# Patient Record
Sex: Male | Born: 1970
Health system: Southern US, Community
[De-identification: ages and names within clinical notes are randomized; demographics above are authoritative.]

## PROBLEM LIST (undated history)

## (undated) DIAGNOSIS — T7840XA Allergy, unspecified, initial encounter: Secondary | ICD-10-CM

## (undated) DIAGNOSIS — H35712 Central serous chorioretinopathy, left eye: Secondary | ICD-10-CM

## (undated) DIAGNOSIS — G4733 Obstructive sleep apnea (adult) (pediatric): Principal | ICD-10-CM

## (undated) HISTORY — PX: VASECTOMY: SHX75

## (undated) HISTORY — DX: Obstructive sleep apnea (adult) (pediatric): G47.33

## (undated) HISTORY — PX: CHOLECYSTECTOMY: SHX55

## (undated) HISTORY — DX: Central serous chorioretinopathy, left eye: H35.712

## (undated) HISTORY — DX: Allergy, unspecified, initial encounter: T78.40XA

---

## 1998-12-03 ENCOUNTER — Encounter: Payer: Self-pay | Admitting: Emergency Medicine

## 1998-12-03 ENCOUNTER — Emergency Department (HOSPITAL_COMMUNITY): Admission: EM | Admit: 1998-12-03 | Discharge: 1998-12-03 | Payer: Self-pay | Admitting: Emergency Medicine

## 1998-12-25 ENCOUNTER — Encounter: Payer: Self-pay | Admitting: General Surgery

## 1998-12-25 ENCOUNTER — Ambulatory Visit (HOSPITAL_COMMUNITY): Admission: RE | Admit: 1998-12-25 | Discharge: 1998-12-26 | Payer: Self-pay | Admitting: General Surgery

## 2001-11-17 ENCOUNTER — Emergency Department (HOSPITAL_COMMUNITY): Admission: EM | Admit: 2001-11-17 | Discharge: 2001-11-18 | Payer: Self-pay | Admitting: Emergency Medicine

## 2002-01-13 ENCOUNTER — Encounter: Admission: RE | Admit: 2002-01-13 | Discharge: 2002-02-21 | Payer: Self-pay | Admitting: Family Medicine

## 2002-02-11 ENCOUNTER — Encounter: Payer: Self-pay | Admitting: Family Medicine

## 2002-02-11 ENCOUNTER — Encounter: Admission: RE | Admit: 2002-02-11 | Discharge: 2002-02-11 | Payer: Self-pay | Admitting: Family Medicine

## 2003-10-25 ENCOUNTER — Emergency Department (HOSPITAL_COMMUNITY): Admission: EM | Admit: 2003-10-25 | Discharge: 2003-10-25 | Payer: Self-pay | Admitting: Family Medicine

## 2008-12-18 ENCOUNTER — Ambulatory Visit: Payer: Self-pay | Admitting: Family Medicine

## 2008-12-18 DIAGNOSIS — N410 Acute prostatitis: Secondary | ICD-10-CM | POA: Insufficient documentation

## 2008-12-18 DIAGNOSIS — M79609 Pain in unspecified limb: Secondary | ICD-10-CM | POA: Insufficient documentation

## 2008-12-20 ENCOUNTER — Ambulatory Visit: Payer: Self-pay | Admitting: Family Medicine

## 2008-12-20 LAB — CONVERTED CEMR LAB
Bilirubin Urine: NEGATIVE
Blood in Urine, dipstick: NEGATIVE
Glucose, Urine, Semiquant: NEGATIVE
Ketones, urine, test strip: NEGATIVE
Nitrite: NEGATIVE
Protein, U semiquant: NEGATIVE
Specific Gravity, Urine: 1.02
Urobilinogen, UA: 0.2
WBC Urine, dipstick: NEGATIVE
pH: 5.5

## 2008-12-21 LAB — CONVERTED CEMR LAB
ALT: 43 units/L (ref 0–53)
AST: 30 units/L (ref 0–37)
Albumin: 4.1 g/dL (ref 3.5–5.2)
Alkaline Phosphatase: 54 units/L (ref 39–117)
BUN: 15 mg/dL (ref 6–23)
Basophils Absolute: 0.1 10*3/uL (ref 0.0–0.1)
Basophils Relative: 0.9 % (ref 0.0–3.0)
Bilirubin, Direct: 0 mg/dL (ref 0.0–0.3)
CO2: 28 meq/L (ref 19–32)
Calcium: 9.1 mg/dL (ref 8.4–10.5)
Chloride: 106 meq/L (ref 96–112)
Cholesterol: 174 mg/dL (ref 0–200)
Creatinine, Ser: 1 mg/dL (ref 0.4–1.5)
Eosinophils Absolute: 0.2 10*3/uL (ref 0.0–0.7)
Eosinophils Relative: 2 % (ref 0.0–5.0)
GFR calc non Af Amer: 88.23 mL/min (ref >60)
Glucose, Bld: 82 mg/dL (ref 70–99)
HCT: 47.9 % (ref 39.0–52.0)
HDL: 35.4 mg/dL — ABNORMAL LOW (ref >39.00)
Hemoglobin: 16.5 g/dL (ref 13.0–17.0)
LDL Cholesterol: 104 mg/dL — ABNORMAL HIGH (ref 0–99)
Lymphocytes Relative: 22 % (ref 12.0–46.0)
Lymphs Abs: 2.1 10*3/uL (ref 0.7–4.0)
MCHC: 34.3 g/dL (ref 30.0–36.0)
MCV: 90.9 fL (ref 78.0–100.0)
Monocytes Absolute: 1.2 10*3/uL — ABNORMAL HIGH (ref 0.1–1.0)
Monocytes Relative: 12.3 % — ABNORMAL HIGH (ref 3.0–12.0)
Neutro Abs: 5.8 10*3/uL (ref 1.4–7.7)
Neutrophils Relative %: 62.8 % (ref 43.0–77.0)
Platelets: 234 10*3/uL (ref 150.0–400.0)
Potassium: 3.9 meq/L (ref 3.5–5.1)
RBC: 5.28 M/uL (ref 4.22–5.81)
RDW: 12.8 % (ref 11.5–14.6)
Sodium: 140 meq/L (ref 135–145)
TSH: 1.81 microintl units/mL (ref 0.35–5.50)
Total Bilirubin: 1.2 mg/dL (ref 0.3–1.2)
Total CHOL/HDL Ratio: 5
Total Protein: 7.5 g/dL (ref 6.0–8.3)
Triglycerides: 173 mg/dL — ABNORMAL HIGH (ref 0.0–149.0)
VLDL: 34.6 mg/dL (ref 0.0–40.0)
WBC: 9.4 10*3/uL (ref 4.5–10.5)

## 2009-01-10 ENCOUNTER — Encounter: Payer: Self-pay | Admitting: Family Medicine

## 2010-02-06 ENCOUNTER — Ambulatory Visit: Payer: Self-pay | Admitting: Family Medicine

## 2010-02-06 LAB — CONVERTED CEMR LAB
Bilirubin Urine: NEGATIVE
Blood in Urine, dipstick: NEGATIVE
Glucose, Urine, Semiquant: NEGATIVE
Ketones, urine, test strip: NEGATIVE
Nitrite: NEGATIVE
Protein, U semiquant: NEGATIVE
Specific Gravity, Urine: >=1.03
Urobilinogen, UA: 0.2
WBC Urine, dipstick: NEGATIVE
pH: 6

## 2010-02-08 LAB — CONVERTED CEMR LAB
BUN: 16 mg/dL (ref 6–23)
Basophils Absolute: 0.1 10*3/uL (ref 0.0–0.1)
Basophils Relative: 1.3 % (ref 0.0–3.0)
CO2: 30 meq/L (ref 19–32)
Calcium: 9.4 mg/dL (ref 8.4–10.5)
Chloride: 105 meq/L (ref 96–112)
Cholesterol: 148 mg/dL (ref 0–200)
Creatinine, Ser: 1 mg/dL (ref 0.4–1.5)
Eosinophils Absolute: 0.2 10*3/uL (ref 0.0–0.7)
Eosinophils Relative: 1.9 % (ref 0.0–5.0)
GFR calc non Af Amer: 91.96 mL/min (ref >60)
Glucose, Bld: 88 mg/dL (ref 70–99)
HCT: 48.2 % (ref 39.0–52.0)
HDL: 41.8 mg/dL (ref >39.00)
Hemoglobin: 16.2 g/dL (ref 13.0–17.0)
LDL Cholesterol: 89 mg/dL (ref 0–99)
Lymphocytes Relative: 23 % (ref 12.0–46.0)
Lymphs Abs: 2.4 10*3/uL (ref 0.7–4.0)
MCHC: 33.7 g/dL (ref 30.0–36.0)
MCV: 92.4 fL (ref 78.0–100.0)
Monocytes Absolute: 1.1 10*3/uL — ABNORMAL HIGH (ref 0.1–1.0)
Monocytes Relative: 10.8 % (ref 3.0–12.0)
Neutro Abs: 6.5 10*3/uL (ref 1.4–7.7)
Neutrophils Relative %: 63 % (ref 43.0–77.0)
Platelets: 259 10*3/uL (ref 150.0–400.0)
Potassium: 4.8 meq/L (ref 3.5–5.1)
RBC: 5.21 M/uL (ref 4.22–5.81)
RDW: 13.8 % (ref 11.5–14.6)
Sodium: 140 meq/L (ref 135–145)
Total CHOL/HDL Ratio: 4
Triglycerides: 86 mg/dL (ref 0.0–149.0)
VLDL: 17.2 mg/dL (ref 0.0–40.0)
WBC: 10.3 10*3/uL (ref 4.5–10.5)

## 2010-02-12 ENCOUNTER — Ambulatory Visit: Payer: Self-pay | Admitting: Family Medicine

## 2010-02-12 DIAGNOSIS — R55 Syncope and collapse: Secondary | ICD-10-CM | POA: Insufficient documentation

## 2010-02-12 LAB — CONVERTED CEMR LAB
ALT: 34 units/L (ref 0–53)
AST: 30 units/L (ref 0–37)
Albumin: 4.1 g/dL (ref 3.5–5.2)
Alkaline Phosphatase: 56 units/L (ref 39–117)
Bilirubin, Direct: 0.3 mg/dL (ref 0.0–0.3)
TSH: 1.33 microintl units/mL (ref 0.35–5.50)
Total Bilirubin: 0.8 mg/dL (ref 0.3–1.2)
Total Protein: 7.1 g/dL (ref 6.0–8.3)

## 2010-07-09 NOTE — Letter (Signed)
Summary: [facility]   Imported By: Maryln Gottron 01/25/2009 10:05:49  _____________________________________________________________________  External Attachment:    Type:   Image     Comment:   External Document

## 2010-07-09 NOTE — Assessment & Plan Note (Signed)
Summary: cpx/njr   Vital Signs:  Patient profile:   40 year old male Height:      67.25 inches Weight:      245 pounds BMI:     38.23 O2 Sat:      98 % Temp:     99 degrees F Pulse rate:   84 / minute BP sitting:   122 / 84  (left arm) Cuff size:   large  Vitals Entered By: Pura Spice, RN (February 12, 2010 10:06 AM) CC: cpx no complaints Is Patient Diabetic? No   History of Present Illness: 40 yr old male for a cpx. He feels fine but he does describe a syncopal episode he experienced several months ago. he thinks he was simply dehydrated. He had been out in the hot sun and drinking beer all day, and he had had no water to drink and very little food. That evening he felt weak and slumped to the floor. His wife called EMS, and their exam was normal. he regained consciousness after just a minute or so. he had no residual symptoms, and he has felt fine ever since. Also he describes constant trouble breathing through his nose, especially at night. No other allergy symptoms.   Allergies (verified): No Known Drug Allergies  Past History:  Past Medical History: Reviewed history from 12/18/2008 and no changes required. torn left ACL, never had surgery  Past Surgical History: Reviewed history from 12/18/2008 and no changes required. Cholecystectomy Vasectomy  Family History: Reviewed history from 12/18/2008 and no changes required. Family History of Alcoholism/Addiction Family History of Arthritis Family History of CAD Male 1st degree relative <50 Family History of Colon CA 1st degree relative <60 Family History Depression Family History High cholesterol Family History Hypertension Family History Ovarian cancer Family History of Stroke M 1st degree relative <50 Family History Diabetes 1st degree relative  Social History: Reviewed history from 12/18/2008 and no changes required. Married Never Smoked Alcohol use-yes Drug use-no Regular exercise-no Occupation:  Charity fundraiser  Review of Systems  The patient denies anorexia, fever, weight loss, weight gain, vision loss, decreased hearing, hoarseness, chest pain, syncope, dyspnea on exertion, peripheral edema, prolonged cough, headaches, hemoptysis, abdominal pain, melena, hematochezia, severe indigestion/heartburn, hematuria, incontinence, genital sores, muscle weakness, suspicious skin lesions, transient blindness, difficulty walking, depression, unusual weight change, abnormal bleeding, enlarged lymph nodes, angioedema, breast masses, and testicular masses.    Physical Exam  General:  overweight-appearing.   Head:  Normocephalic and atraumatic without obvious abnormalities. No apparent alopecia or balding. Eyes:  No corneal or conjunctival inflammation noted. EOMI. Perrla. Funduscopic exam benign, without hemorrhages, exudates or papilledema. Vision grossly normal. Ears:  External ear exam shows no significant lesions or deformities.  Otoscopic examination reveals clear canals, tympanic membranes are intact bilaterally without bulging, retraction, inflammation or discharge. Hearing is grossly normal bilaterally. Nose:  his septum is deviated to the right, and the right nostril is extremely narrow Mouth:  Oral mucosa and oropharynx without lesions or exudates.  Teeth in good repair. Neck:  No deformities, masses, or tenderness noted. No carotid bruits Chest Wall:  No deformities, masses, tenderness or gynecomastia noted. Lungs:  Normal respiratory effort, chest expands symmetrically. Lungs are clear to auscultation, no crackles or wheezes. Heart:  Normal rate and regular rhythm. S1 and S2 normal without gallop, murmur, click, rub or other extra sounds. EKG normal Abdomen:  Bowel sounds positive,abdomen soft and non-tender without masses, organomegaly or hernias noted. Genitalia:  Testes bilaterally descended without nodularity,  tenderness or masses. No scrotal masses or lesions. No penis lesions or urethral  discharge. Msk:  No deformity or scoliosis noted of thoracic or lumbar spine.   Pulses:  R and L carotid,radial,femoral,dorsalis pedis and posterior tibial pulses are full and equal bilaterally Extremities:  No clubbing, cyanosis, edema, or deformity noted with normal full range of motion of all joints.   Neurologic:  No cranial nerve deficits noted. Station and gait are normal. Plantar reflexes are down-going bilaterally. DTRs are symmetrical throughout. Sensory, motor and coordinative functions appear intact. Skin:  Intact without suspicious lesions or rashes Cervical Nodes:  No lymphadenopathy noted Axillary Nodes:  No palpable lymphadenopathy Inguinal Nodes:  No significant adenopathy Psych:  Cognition and judgment appear intact. Alert and cooperative with normal attention span and concentration. No apparent delusions, illusions, hallucinations   Impression & Recommendations:  Problem # 1:  HEALTH MAINTENANCE EXAM (ICD-V70.0)  Orders: TLB-Hepatic/Liver Function Pnl (80076-HEPATIC) TLB-TSH (Thyroid Stimulating Hormone) (84443-TSH) Venipuncture (16073)  Complete Medication List: 1)  Nsaids  .... As needed 2)  Flonase 50 Mcg/act Susp (Fluticasone propionate) .... 2 sprays each nostril once daily  Other Orders: EKG w/ Interpretation (93000)  Patient Instructions: 1)  It is important that you exercise reguarly at least 20 minutes 5 times a week. If you develop chest pain, have severe difficulty breathing, or feel very tired, stop exercising immediately and seek medical attention.  2)  You need to lose weight. Consider a lower calorie diet and regular exercise.  3)  Try Flonase for one month Prescriptions: FLONASE 50 MCG/ACT SUSP (FLUTICASONE PROPIONATE) 2 sprays each nostril once daily  #30 x 11   Entered and Authorized by:   Nelwyn Salisbury MD   Signed by:   Nelwyn Salisbury MD on 02/12/2010   Method used:   [facility]        [facility] [address]       [address]       [place], [place]  [address]       Ph: [phone] or [phone]       Fax: [phone]   RxID:   [id number]

## 2010-07-09 NOTE — Assessment & Plan Note (Signed)
Summary: TO BE EST/NJR   Vital Signs:  Patient profile:   40 year old male Height:      67 inches Weight:      269 pounds BMI:     42.28 Temp:     98.9 degrees F oral Pulse rate:   96 / minute Pulse rhythm:   regular BP sitting:   134 / 80  (left arm) Cuff size:   large  Vitals Entered By: Alfred Levins, CMA (December 18, 2008 1:34 PM) CC: npx, not fasting   History of Present Illness: 40 yr old male to establish and for cpx. he has not seen a doctor for years. He knows he is overweight. For about 3 months has had sharp pains at the right 2nd MTP joint which started after a hunting trip. No swelling or redness or warmth noted. Motrin helps. Also for the  past 2 months has occasional urgency to urinate and some rectal discomfort. No burning or DC.   Preventive Screening-Counseling & Management  Alcohol-Tobacco     Smoking Status: never  Caffeine-Diet-Exercise     Does Patient Exercise: no      Drug Use:  no.    Allergies (verified): No Known Drug Allergies  Past History:  Past Medical History: torn left ACL, never had surgery  Past Surgical History: Cholecystectomy Vasectomy  Family History: Reviewed history and no changes required. Family History of Alcoholism/Addiction Family History of Arthritis Family History of CAD Male 1st degree relative <50 Family History of Colon CA 1st degree relative <60 Family History Depression Family History High cholesterol Family History Hypertension Family History Ovarian cancer Family History of Stroke M 1st degree relative <50 Family History Diabetes 1st degree relative  Social History: Reviewed history and no changes required. Married Never Smoked Alcohol use-yes Drug use-no Regular exercise-no Occupation: Charity fundraiser  Smoking Status:  never Drug Use:  no Does Patient Exercise:  no Occupation:  employed  Review of Systems  The patient denies anorexia, fever, weight loss, weight gain, vision loss, decreased hearing,  hoarseness, chest pain, syncope, dyspnea on exertion, peripheral edema, prolonged cough, headaches, hemoptysis, abdominal pain, melena, hematochezia, severe indigestion/heartburn, hematuria, incontinence, genital sores, muscle weakness, suspicious skin lesions, transient blindness, difficulty walking, depression, unusual weight change, abnormal bleeding, enlarged lymph nodes, angioedema, breast masses, and testicular masses.    Physical Exam  General:  overweight-appearing.   Head:  Normocephalic and atraumatic without obvious abnormalities. No apparent alopecia or balding. Eyes:  No corneal or conjunctival inflammation noted. EOMI. Perrla. Funduscopic exam benign, without hemorrhages, exudates or papilledema. Vision grossly normal. Ears:  External ear exam shows no significant lesions or deformities.  Otoscopic examination reveals clear canals, tympanic membranes are intact bilaterally without bulging, retraction, inflammation or discharge. Hearing is grossly normal bilaterally. Nose:  External nasal examination shows no deformity or inflammation. Nasal mucosa are pink and moist without lesions or exudates. Mouth:  Oral mucosa and oropharynx without lesions or exudates.  Teeth in good repair. Neck:  No deformities, masses, or tenderness noted. Chest Wall:  No deformities, masses, tenderness or gynecomastia noted. Lungs:  Normal respiratory effort, chest expands symmetrically. Lungs are clear to auscultation, no crackles or wheezes. Heart:  Normal rate and regular rhythm. S1 and S2 normal without gallop, murmur, click, rub or other extra sounds. Abdomen:  Bowel sounds positive,abdomen soft and non-tender without masses, organomegaly or hernias noted. Rectal:  No external abnormalities noted. Normal sphincter tone. No rectal masses. Genitalia:  Testes bilaterally descended without nodularity, tenderness or  masses. No scrotal masses or lesions. No penis lesions or urethral discharge. Prostate:  no  nodules, no asymmetry, tender, and 1+ enlarged.   Msk:  No deformity or scoliosis noted of thoracic or lumbar spine.   Pulses:  R and L carotid,radial,femoral,dorsalis pedis and posterior tibial pulses are full and equal bilaterally Extremities:  normal except tender at the right 2nd MTP Neurologic:  No cranial nerve deficits noted. Station and gait are normal. Plantar reflexes are down-going bilaterally. DTRs are symmetrical throughout. Sensory, motor and coordinative functions appear intact. Skin:  Intact without suspicious lesions or rashes Cervical Nodes:  No lymphadenopathy noted Axillary Nodes:  No palpable lymphadenopathy Inguinal Nodes:  No significant adenopathy Psych:  Cognition and judgment appear intact. Alert and cooperative with normal attention span and concentration. No apparent delusions, illusions, hallucinations   Impression & Recommendations:  Problem # 1:  HEALTH MAINTENANCE EXAM (ICD-V70.0)  Problem # 2:  PROSTATITIS, ACUTE (ICD-601.0)  Problem # 3:  FOOT PAIN (ICD-729.5)  Orders: Podiatry Referral (Podiatry)  Complete Medication List: 1)  Nsaids  .... As needed 2)  Doxycycline Hyclate 100 Mg Caps (Doxycycline hyclate) .... Two times a day  Patient Instructions: 1)  It is important that you exercise reguarly at least 20 minutes 5 times a week. If you develop chest pain, have severe difficulty breathing, or feel very tired, stop exercising immediately and seek medical attention.  2)  You need to lose weight. Consider a lower calorie diet and regular exercise.  3)  He has a low level prostatitis, so treat with Doxycycline.  4)  Refer to Podiatry Prescriptions: DOXYCYCLINE HYCLATE 100 MG CAPS (DOXYCYCLINE HYCLATE) two times a day  #28 x 0   Entered and Authorized by:   Nelwyn Salisbury MD   Signed by:   Nelwyn Salisbury MD on 12/18/2008   Method used:   [facility]        [facility] [address] [facility]       [address]       [place], [place]  [address]        Ph: [phone] or [phone]       Fax: [phone]   RxID:   [id number]

## 2010-07-16 ENCOUNTER — Ambulatory Visit (INDEPENDENT_AMBULATORY_CARE_PROVIDER_SITE_OTHER): Payer: 59 | Admitting: Family Medicine

## 2010-07-16 ENCOUNTER — Encounter: Payer: Self-pay | Admitting: Family Medicine

## 2010-07-16 VITALS — BP 140/90 | HR 93 | Temp 98.9°F | Wt 262.0 lb

## 2010-07-16 DIAGNOSIS — H6990 Unspecified Eustachian tube disorder, unspecified ear: Secondary | ICD-10-CM

## 2010-07-16 DIAGNOSIS — H6980 Other specified disorders of Eustachian tube, unspecified ear: Secondary | ICD-10-CM

## 2010-07-16 MED ORDER — METHYLPREDNISOLONE ACETATE 80 MG/ML IJ SUSP
120.0000 mg | Freq: Once | INTRAMUSCULAR | Status: AC
  Administered 2010-07-16: 120 mg via INTRAMUSCULAR

## 2010-07-16 MED ORDER — FLUTICASONE PROPIONATE 50 MCG/ACT NA SUSP: 1.0000 | Freq: Every day | NASAL | Status: DC

## 2010-07-16 NOTE — Progress Notes (Signed)
  Subjective:    Patient ID: Mitchell Castillo, male    DOB: [birth year], 40 y.o.   MRN: [id number]  HPI Here for one week of sinus pressure, ear pressure, and decreased hearing. No fever or HA or ST or cough.    Review of Systems  Constitutional: Negative.   HENT: Positive for hearing loss, ear pain, congestion, sinus pressure and tinnitus. Negative for facial swelling, sneezing, postnasal drip and ear discharge.   Eyes: Negative.   Respiratory: Negative.        Objective:   Physical Exam  Constitutional: He appears well-developed and well-nourished. No distress.  HENT:  Head: Normocephalic and atraumatic.  Right Ear: External ear normal.  Left Ear: External ear normal.  Nose: Nose normal.  Mouth/Throat: Oropharynx is clear and moist. No oropharyngeal exudate.  Eyes: Conjunctivae and EOM are normal. Pupils are equal, round, and reactive to light. Right eye exhibits no discharge. Left eye exhibits no discharge.  Neck: Normal range of motion. Neck supple. No thyromegaly present.  Pulmonary/Chest: Effort normal and breath sounds normal. No respiratory distress. He has no wheezes. He has no rales. He exhibits no tenderness.  Lymphadenopathy:    He has no cervical adenopathy.          Assessment & Plan:  This seems to be from an allergic cause.

## 2011-04-09 ENCOUNTER — Ambulatory Visit (INDEPENDENT_AMBULATORY_CARE_PROVIDER_SITE_OTHER): Payer: 59 | Admitting: Family Medicine

## 2011-04-09 ENCOUNTER — Encounter: Payer: Self-pay | Admitting: Family Medicine

## 2011-04-09 VITALS — BP 140/86 | HR 108 | Temp 99.2°F | Wt 266.0 lb

## 2011-04-09 DIAGNOSIS — H6980 Other specified disorders of Eustachian tube, unspecified ear: Secondary | ICD-10-CM

## 2011-04-09 DIAGNOSIS — Z9109 Other allergy status, other than to drugs and biological substances: Secondary | ICD-10-CM

## 2011-04-09 DIAGNOSIS — J309 Allergic rhinitis, unspecified: Secondary | ICD-10-CM

## 2011-04-09 MED ORDER — FLUTICASONE PROPIONATE 50 MCG/ACT NA SUSP: 2.0000 | Freq: Every day | NASAL | Status: DC

## 2011-04-09 MED ORDER — METHYLPREDNISOLONE ACETATE 80 MG/ML IJ SUSP
120.0000 mg | Freq: Once | INTRAMUSCULAR | Status: AC
  Administered 2011-04-09: 120 mg via INTRAMUSCULAR

## 2011-04-09 NOTE — Progress Notes (Signed)
Addended by: Aniceto Boss A on: 04/09/2011 10:40 AM   Modules accepted: Orders

## 2011-04-09 NOTE — Progress Notes (Signed)
  Subjective:    Patient ID: Mitchell Castillo, male    DOB: Jan 30, 1971, 40 y.o.   MRN: [id number]  HPI Here for one week of pressure and pain in the left ear. No fever or ST or cough.    Review of Systems  Constitutional: Negative.   HENT: Positive for ear pain. Negative for hearing loss, congestion, rhinorrhea, postnasal drip and tinnitus.   Eyes: Negative.   Respiratory: Negative.        Objective:   Physical Exam  Constitutional: He appears well-developed and well-nourished.  HENT:  Right Ear: External ear normal.  Left Ear: External ear normal.  Nose: Nose normal.  Mouth/Throat: Oropharynx is clear and moist. No oropharyngeal exudate.  Eyes: Conjunctivae are normal. Pupils are equal, round, and reactive to light.  Neck: Neck supple. No thyromegaly present.  Pulmonary/Chest: Effort normal and breath sounds normal.  Lymphadenopathy:    He has no cervical adenopathy.          Assessment & Plan:  Refilled Flonase. Add Claritin D prn

## 2012-04-02 ENCOUNTER — Telehealth: Payer: Self-pay | Admitting: Family Medicine

## 2012-04-02 NOTE — Telephone Encounter (Signed)
Shashank wife Raynelle Fanning will be new pt . Can I sch?

## 2012-04-02 NOTE — Telephone Encounter (Signed)
Pt wife sch with Dr Clent Ridges

## 2012-04-02 NOTE — Telephone Encounter (Signed)
Per Dr. Clent Ridges - ok to schedule

## 2013-04-25 ENCOUNTER — Ambulatory Visit: Payer: 59 | Attending: Physician Assistant | Admitting: Physical Therapy

## 2013-04-25 DIAGNOSIS — M25579 Pain in unspecified ankle and joints of unspecified foot: Secondary | ICD-10-CM | POA: Insufficient documentation

## 2013-04-25 DIAGNOSIS — R5381 Other malaise: Secondary | ICD-10-CM | POA: Insufficient documentation

## 2013-04-25 DIAGNOSIS — M25673 Stiffness of unspecified ankle, not elsewhere classified: Secondary | ICD-10-CM | POA: Insufficient documentation

## 2013-04-25 DIAGNOSIS — IMO0001 Reserved for inherently not codable concepts without codable children: Secondary | ICD-10-CM | POA: Insufficient documentation

## 2013-04-25 DIAGNOSIS — M25676 Stiffness of unspecified foot, not elsewhere classified: Secondary | ICD-10-CM | POA: Insufficient documentation

## 2013-04-28 ENCOUNTER — Ambulatory Visit: Payer: 59

## 2013-04-29 ENCOUNTER — Ambulatory Visit: Payer: 59 | Admitting: Physical Therapy

## 2013-05-02 ENCOUNTER — Ambulatory Visit: Payer: 59 | Admitting: Physical Therapy

## 2013-05-09 ENCOUNTER — Ambulatory Visit: Payer: 59 | Attending: Physician Assistant | Admitting: Physical Therapy

## 2013-05-09 DIAGNOSIS — M25673 Stiffness of unspecified ankle, not elsewhere classified: Secondary | ICD-10-CM | POA: Insufficient documentation

## 2013-05-09 DIAGNOSIS — M25579 Pain in unspecified ankle and joints of unspecified foot: Secondary | ICD-10-CM | POA: Insufficient documentation

## 2013-05-09 DIAGNOSIS — IMO0001 Reserved for inherently not codable concepts without codable children: Secondary | ICD-10-CM | POA: Insufficient documentation

## 2013-05-09 DIAGNOSIS — M25676 Stiffness of unspecified foot, not elsewhere classified: Secondary | ICD-10-CM | POA: Insufficient documentation

## 2013-05-09 DIAGNOSIS — R5381 Other malaise: Secondary | ICD-10-CM | POA: Insufficient documentation

## 2013-05-11 ENCOUNTER — Ambulatory Visit: Payer: 59 | Admitting: Physical Therapy

## 2013-05-17 ENCOUNTER — Ambulatory Visit: Payer: 59 | Admitting: Physical Therapy

## 2013-05-19 ENCOUNTER — Ambulatory Visit: Payer: 59

## 2013-05-25 ENCOUNTER — Ambulatory Visit: Payer: 59

## 2013-05-27 ENCOUNTER — Ambulatory Visit: Payer: 59 | Admitting: Physical Therapy

## 2013-05-30 ENCOUNTER — Ambulatory Visit: Payer: 59 | Admitting: Physical Therapy

## 2013-06-07 ENCOUNTER — Ambulatory Visit: Payer: 59 | Admitting: Physical Therapy

## 2013-06-08 ENCOUNTER — Other Ambulatory Visit (HOSPITAL_COMMUNITY): Payer: Self-pay | Admitting: Orthopedic Surgery

## 2013-06-08 DIAGNOSIS — M25571 Pain in right ankle and joints of right foot: Secondary | ICD-10-CM

## 2013-06-21 ENCOUNTER — Ambulatory Visit (HOSPITAL_COMMUNITY)
Admission: RE | Admit: 2013-06-21 | Discharge: 2013-06-21 | Disposition: A | Discharge status: Home / Self Care | Payer: 59 | Source: Ambulatory Visit | Attending: Orthopedic Surgery | Admitting: Orthopedic Surgery

## 2013-06-21 DIAGNOSIS — S8990XA Unspecified injury of unspecified lower leg, initial encounter: Secondary | ICD-10-CM | POA: Insufficient documentation

## 2013-06-21 DIAGNOSIS — M25571 Pain in right ankle and joints of right foot: Secondary | ICD-10-CM

## 2013-06-21 DIAGNOSIS — M79609 Pain in unspecified limb: Secondary | ICD-10-CM | POA: Insufficient documentation

## 2013-06-21 DIAGNOSIS — Y33XXXA Other specified events, undetermined intent, initial encounter: Secondary | ICD-10-CM | POA: Insufficient documentation

## 2013-06-21 DIAGNOSIS — M898X9 Other specified disorders of bone, unspecified site: Secondary | ICD-10-CM | POA: Insufficient documentation

## 2013-06-21 DIAGNOSIS — S99929A Unspecified injury of unspecified foot, initial encounter: Secondary | ICD-10-CM

## 2013-06-21 DIAGNOSIS — S99919A Unspecified injury of unspecified ankle, initial encounter: Secondary | ICD-10-CM | POA: Insufficient documentation

## 2013-07-04 ENCOUNTER — Encounter: Payer: Self-pay | Admitting: Family Medicine

## 2013-07-04 ENCOUNTER — Ambulatory Visit (INDEPENDENT_AMBULATORY_CARE_PROVIDER_SITE_OTHER): Payer: 59 | Admitting: Family Medicine

## 2013-07-04 VITALS — BP 134/76 | HR 104 | Temp 98.3°F | Ht 67.25 in | Wt 273.0 lb

## 2013-07-04 DIAGNOSIS — B029 Zoster without complications: Secondary | ICD-10-CM

## 2013-07-04 DIAGNOSIS — L259 Unspecified contact dermatitis, unspecified cause: Secondary | ICD-10-CM

## 2013-07-04 DIAGNOSIS — L309 Dermatitis, unspecified: Secondary | ICD-10-CM | POA: Insufficient documentation

## 2013-07-04 MED ORDER — VALACYCLOVIR HCL 1 G PO TABS: 1000.0000 mg | ORAL_TABLET | Freq: Three times a day (TID) | ORAL | Status: DC

## 2013-07-04 MED ORDER — HALOBETASOL PROPIONATE 0.05 % EX OINT: TOPICAL_OINTMENT | Freq: Two times a day (BID) | CUTANEOUS | Status: DC

## 2013-07-04 MED ORDER — METHYLPREDNISOLONE 4 MG PO KIT: PACK | ORAL | Status: AC

## 2013-07-04 NOTE — Progress Notes (Signed)
   Subjective:    Patient ID: Mitchell Castillo, male    DOB: [birth year], 43 y.o.   MRN: [id number]  HPI Here for two things. First he has had a patch of itchy skin on the right lower leg for a year or so. Second he started having burning pains around the right upper chest and back 10 days ago, and then he noticed a red rash in this area 2 days ago.    Review of Systems  Constitutional: Negative.   Skin: Positive for rash.       Objective:   Physical Exam  Constitutional: He appears well-developed and well-nourished.  Skin:  Scattered red macules and vesicles over the right upper back and right upper chest. The right lower leg has a patch of red macular scaly skin           Assessment & Plan:  Treat the shingles with valtrex and a steroid taper. Treat the eczema with halobetasol.

## 2013-07-04 NOTE — Progress Notes (Signed)
Pre visit review using our clinic review tool, if applicable. No additional management support is needed unless otherwise documented below in the visit note. 

## 2015-03-02 ENCOUNTER — Ambulatory Visit (INDEPENDENT_AMBULATORY_CARE_PROVIDER_SITE_OTHER): Payer: 59 | Admitting: Family Medicine

## 2015-03-02 ENCOUNTER — Encounter: Payer: Self-pay | Admitting: Family Medicine

## 2015-03-02 VITALS — BP 127/88 | HR 80 | Temp 98.4°F | Ht 67.25 in | Wt 264.0 lb

## 2015-03-02 DIAGNOSIS — K529 Noninfective gastroenteritis and colitis, unspecified: Secondary | ICD-10-CM

## 2015-03-02 MED ORDER — CIPROFLOXACIN HCL 500 MG PO TABS: 500.0000 mg | ORAL_TABLET | Freq: Two times a day (BID) | ORAL | Status: DC

## 2015-03-02 NOTE — Progress Notes (Signed)
Pre visit review using our clinic review tool, if applicable. No additional management support is needed unless otherwise documented below in the visit note. 

## 2015-03-02 NOTE — Progress Notes (Signed)
   Subjective:    Patient ID: Mitchell Castillo, male    DOB: [birth year], 44 y.o.   MRN: [id number]  HPI Here for one week of mild generalized abdominal cramps and diarrhea. No fever. No N or V. Drinking fluids. Pepto-Bismol helps for awhile. He just got home for a trip to a resort in [place].    Review of Systems  Constitutional: Negative.   Respiratory: Negative.   Cardiovascular: Negative.   Gastrointestinal: Positive for abdominal pain and diarrhea. Negative for nausea, vomiting, constipation, blood in stool, abdominal distention, anal bleeding and rectal pain.  Genitourinary: Negative.        Objective:   Physical Exam  Constitutional: He appears well-developed and well-nourished.  Cardiovascular: Normal rate, regular rhythm, normal heart sounds and intact distal pulses.   Pulmonary/Chest: Effort normal and breath sounds normal.  Abdominal: Soft. Bowel sounds are normal. He exhibits no distension and no mass. There is no tenderness. There is no rebound and no guarding.          Assessment & Plan:  travellers diarrhea. Treat with Cipro.

## 2015-03-05 ENCOUNTER — Ambulatory Visit: Payer: Self-pay | Admitting: Family Medicine

## 2015-06-25 ENCOUNTER — Encounter: Payer: Self-pay | Admitting: Family Medicine

## 2015-06-25 MED ORDER — DOXYCYCLINE HYCLATE 100 MG PO TABS: 100.0000 mg | ORAL_TABLET | Freq: Two times a day (BID) | ORAL | Status: DC

## 2015-06-25 MED FILL — DOXYCYCLINE HYCLATE 100 MG: 100 | 10 days supply | Qty: 20 | Fill #0

## 2015-06-25 NOTE — Telephone Encounter (Signed)
Just to cover any possible infections, call in Doxycycline 100 mg bid for 10 days

## 2015-07-13 ENCOUNTER — Encounter: Payer: Self-pay | Admitting: Family Medicine

## 2015-07-13 ENCOUNTER — Ambulatory Visit (INDEPENDENT_AMBULATORY_CARE_PROVIDER_SITE_OTHER): Payer: 59 | Admitting: Family Medicine

## 2015-07-13 VITALS — BP 122/80 | Temp 98.1°F | Ht 67.25 in | Wt 272.0 lb

## 2015-07-13 DIAGNOSIS — H6980 Other specified disorders of Eustachian tube, unspecified ear: Secondary | ICD-10-CM | POA: Diagnosis not present

## 2015-07-13 MED ORDER — METHYLPREDNISOLONE ACETATE 80 MG/ML IJ SUSP
120.0000 mg | Freq: Once | INTRAMUSCULAR | Status: AC
  Administered 2015-07-13: 120 mg via INTRAMUSCULAR

## 2015-07-13 MED ORDER — SCOPOLAMINE 1 MG/3DAYS TD PT72: 1.0000 | MEDICATED_PATCH | TRANSDERMAL | Status: DC

## 2015-07-13 MED FILL — TRANSDERM-SCOP 1.5 MG/72HR: 1 | 12 days supply | Qty: 4 | Fill #0

## 2015-07-13 NOTE — Addendum Note (Signed)
Addended by: Aniceto Boss A on: 07/13/2015 10:45 AM   Modules accepted: Orders

## 2015-07-13 NOTE — Progress Notes (Signed)
Pre visit review using our clinic review tool, if applicable. No additional management support is needed unless otherwise documented below in the visit note. 

## 2015-07-13 NOTE — Progress Notes (Signed)
   Subjective:    Patient ID: Mitchell Castillo, male    DOB: Dec 29, 1970, 45 y.o.   MRN: [id number]  HPI Here for several weeks of stuffy head, ear pressure and decreased hearing. He is using Mucinex and Sudafed. He is concerned because he has a plane flight to [place] this weekend for a vacation.   Review of Systems  Constitutional: Negative.   HENT: Positive for congestion, ear pain and hearing loss. Negative for postnasal drip, sinus pressure and sore throat.   Eyes: Negative.   Respiratory: Negative.   Neurological: Negative.        Objective:   Physical Exam  Constitutional: He appears well-developed and well-nourished.  HENT:  Right Ear: External ear normal.  Left Ear: External ear normal.  Nose: Nose normal.  Mouth/Throat: Oropharynx is clear and moist.  Eyes: Conjunctivae are normal.  Neck: Neck supple. No thyromegaly present.  Pulmonary/Chest: Effort normal and breath sounds normal.  Lymphadenopathy:    He has no cervical adenopathy.          Assessment & Plan:  Eustachian tube dysfunction. Given a steroid shot.

## 2015-08-04 ENCOUNTER — Telehealth: Payer: 59 | Admitting: Family

## 2015-08-04 DIAGNOSIS — J09X2 Influenza due to identified novel influenza A virus with other respiratory manifestations: Secondary | ICD-10-CM | POA: Diagnosis not present

## 2015-08-04 MED ORDER — OSELTAMIVIR PHOSPHATE 75 MG PO CAPS: 75.0000 mg | ORAL_CAPSULE | Freq: Two times a day (BID) | ORAL | Status: DC

## 2015-08-04 NOTE — Progress Notes (Signed)

## 2016-02-19 ENCOUNTER — Encounter: Payer: Self-pay | Admitting: Family Medicine

## 2016-02-26 ENCOUNTER — Encounter: Payer: Self-pay | Admitting: Family Medicine

## 2016-02-26 ENCOUNTER — Ambulatory Visit (INDEPENDENT_AMBULATORY_CARE_PROVIDER_SITE_OTHER): Payer: 59 | Admitting: Family Medicine

## 2016-02-26 VITALS — BP 150/90 | Temp 98.3°F | Ht 67.25 in | Wt 280.0 lb

## 2016-02-26 DIAGNOSIS — Z0000 Encounter for general adult medical examination without abnormal findings: Secondary | ICD-10-CM

## 2016-02-26 DIAGNOSIS — G4733 Obstructive sleep apnea (adult) (pediatric): Secondary | ICD-10-CM

## 2016-02-26 NOTE — Progress Notes (Signed)
   Subjective:    Patient ID: Mitchell Castillo, male    DOB: 1971-02-27, 45 y.o.   MRN: [id number]  HPI 45 yr old male for a well exam. He feels well but he thinks he may have sleep apnea. He does feel tired most of the time. His wife says he snores loudly and sometimes stops breathing at night. We discussed his BP reading today, and he tells me that one week ago in his dentist's office it was 136/82.    Review of Systems  Constitutional: Positive for fatigue.  HENT: Negative.   Eyes: Negative.   Respiratory: Negative.   Cardiovascular: Negative.   Gastrointestinal: Negative.   Genitourinary: Negative.   Musculoskeletal: Negative.   Skin: Negative.   Neurological: Negative.   Psychiatric/Behavioral: Negative.        Objective:   Physical Exam  Constitutional: He is oriented to person, place, and time. No distress.  Morbidly obese   HENT:  Head: Normocephalic and atraumatic.  Right Ear: External ear normal.  Left Ear: External ear normal.  Nose: Nose normal.  Mouth/Throat: Oropharynx is clear and moist. No oropharyngeal exudate.  Eyes: Conjunctivae and EOM are normal. Pupils are equal, round, and reactive to light. Right eye exhibits no discharge. Left eye exhibits no discharge. No scleral icterus.  Neck: Neck supple. No JVD present. No tracheal deviation present. No thyromegaly present.  Cardiovascular: Normal rate, regular rhythm, normal heart sounds and intact distal pulses.  Exam reveals no gallop and no friction rub.   No murmur heard. Pulmonary/Chest: Effort normal and breath sounds normal. No respiratory distress. He has no wheezes. He has no rales. He exhibits no tenderness.  Abdominal: Soft. Bowel sounds are normal. He exhibits no distension and no mass. There is no tenderness. There is no rebound and no guarding.  Genitourinary: Rectum normal, prostate normal and penis normal. Rectal exam shows guaiac negative stool. No penile tenderness.  Musculoskeletal: Normal range of  motion. He exhibits no edema or tenderness.  Lymphadenopathy:    He has no cervical adenopathy.  Neurological: He is alert and oriented to person, place, and time. He has normal reflexes. No cranial nerve deficit. He exhibits normal muscle tone. Coordination normal.  Skin: Skin is warm and dry. No rash noted. He is not diaphoretic. No erythema. No pallor.  Psychiatric: He has a normal mood and affect. His behavior is normal. Judgment and thought content normal.          Assessment & Plan:  Well exam. We discussed diet and exercise. We will watch the BP closely. Refer for a sleep evaluation. Get fasting labs soon.

## 2016-02-26 NOTE — Progress Notes (Signed)
Pre visit review using our clinic review tool, if applicable. No additional management support is needed unless otherwise documented below in the visit note. 

## 2016-03-03 ENCOUNTER — Other Ambulatory Visit (INDEPENDENT_AMBULATORY_CARE_PROVIDER_SITE_OTHER): Payer: 59

## 2016-03-03 DIAGNOSIS — Z0000 Encounter for general adult medical examination without abnormal findings: Secondary | ICD-10-CM

## 2016-03-03 LAB — CBC WITH DIFFERENTIAL/PLATELET
Basophils Absolute: 0.1 10*3/uL (ref 0.0–0.1)
Basophils Relative: 0.8 % (ref 0.0–3.0)
Eosinophils Absolute: 0.1 10*3/uL (ref 0.0–0.7)
Eosinophils Relative: 1.5 % (ref 0.0–5.0)
HCT: 49.4 % (ref 39.0–52.0)
Hemoglobin: 16.7 g/dL (ref 13.0–17.0)
Lymphocytes Relative: 23.8 % (ref 12.0–46.0)
Lymphs Abs: 2.3 10*3/uL (ref 0.7–4.0)
MCHC: 33.8 g/dL (ref 30.0–36.0)
MCV: 90.2 fl (ref 78.0–100.0)
Monocytes Absolute: 1 10*3/uL (ref 0.1–1.0)
Monocytes Relative: 10.5 % (ref 3.0–12.0)
Neutro Abs: 6.1 10*3/uL (ref 1.4–7.7)
Neutrophils Relative %: 63.4 % (ref 43.0–77.0)
Platelets: 279 10*3/uL (ref 150.0–400.0)
RBC: 5.48 Mil/uL (ref 4.22–5.81)
RDW: 13.5 % (ref 11.5–15.5)
WBC: 9.6 10*3/uL (ref 4.0–10.5)

## 2016-03-03 LAB — HEPATIC FUNCTION PANEL
ALT: 36 U/L (ref 0–53)
AST: 22 U/L (ref 0–37)
Albumin: 4.1 g/dL (ref 3.5–5.2)
Alkaline Phosphatase: 58 U/L (ref 39–117)
Bilirubin, Direct: 0.1 mg/dL (ref 0.0–0.3)
Total Bilirubin: 0.9 mg/dL (ref 0.2–1.2)
Total Protein: 7.4 g/dL (ref 6.0–8.3)

## 2016-03-03 LAB — TSH: TSH: 1.26 u[IU]/mL (ref 0.35–4.50)

## 2016-03-03 LAB — BASIC METABOLIC PANEL
BUN: 11 mg/dL (ref 6–23)
CO2: 26 mEq/L (ref 19–32)
Calcium: 9.1 mg/dL (ref 8.4–10.5)
Chloride: 104 mEq/L (ref 96–112)
Creatinine, Ser: 0.97 mg/dL (ref 0.40–1.50)
GFR: 88.33 mL/min (ref >60.00)
Glucose, Bld: 79 mg/dL (ref 70–99)
Potassium: 3.8 mEq/L (ref 3.5–5.1)
Sodium: 138 mEq/L (ref 135–145)

## 2016-03-03 LAB — POC URINALSYSI DIPSTICK (AUTOMATED)
Bilirubin, UA: NEGATIVE
Blood, UA: NEGATIVE
Glucose, UA: NEGATIVE
Ketones, UA: NEGATIVE
Leukocytes, UA: NEGATIVE
Nitrite, UA: NEGATIVE
Protein, UA: NEGATIVE
Spec Grav, UA: 1.02
Urobilinogen, UA: 0.2
pH, UA: 5.5

## 2016-03-03 LAB — LIPID PANEL
Cholesterol: 148 mg/dL (ref 0–200)
HDL: 34.8 mg/dL — ABNORMAL LOW (ref >39.00)
LDL Cholesterol: 86 mg/dL (ref 0–99)
NonHDL: 113.31
Total CHOL/HDL Ratio: 4
Triglycerides: 135 mg/dL (ref 0.0–149.0)
VLDL: 27 mg/dL (ref 0.0–40.0)

## 2016-03-18 ENCOUNTER — Encounter: Payer: Self-pay | Admitting: Family Medicine

## 2016-03-24 DIAGNOSIS — H35712 Central serous chorioretinopathy, left eye: Secondary | ICD-10-CM | POA: Diagnosis not present

## 2016-04-03 DIAGNOSIS — H33323 Round hole, bilateral: Secondary | ICD-10-CM | POA: Diagnosis not present

## 2016-04-03 DIAGNOSIS — H35412 Lattice degeneration of retina, left eye: Secondary | ICD-10-CM | POA: Diagnosis not present

## 2016-04-03 DIAGNOSIS — H35712 Central serous chorioretinopathy, left eye: Secondary | ICD-10-CM | POA: Diagnosis not present

## 2016-05-09 ENCOUNTER — Encounter: Payer: Self-pay | Admitting: Pulmonary Disease

## 2016-05-09 ENCOUNTER — Ambulatory Visit (INDEPENDENT_AMBULATORY_CARE_PROVIDER_SITE_OTHER): Payer: 59 | Admitting: Pulmonary Disease

## 2016-05-09 VITALS — BP 130/80 | HR 109 | Ht 69.0 in | Wt 279.0 lb

## 2016-05-09 DIAGNOSIS — Z6841 Body Mass Index (BMI) 40.0 and over, adult: Secondary | ICD-10-CM

## 2016-05-09 DIAGNOSIS — R0683 Snoring: Secondary | ICD-10-CM

## 2016-05-09 NOTE — Patient Instructions (Signed)
Will arrange for home sleep study Will call to arrange for follow up after sleep study reviewed  

## 2016-05-09 NOTE — Progress Notes (Signed)
   Subjective:    Patient ID: Mitchell Castillo, male    DOB: [birth year], 45 y.o.   MRN: [id number]  HPI    Review of Systems  Constitutional: Negative for fever and unexpected weight change.  HENT: Positive for postnasal drip. Negative for congestion, dental problem, ear pain, nosebleeds, rhinorrhea, sinus pressure, sneezing, sore throat and trouble swallowing.   Eyes: Negative for redness and itching.  Respiratory: Negative for cough, chest tightness, shortness of breath and wheezing.   Cardiovascular: Negative for palpitations and leg swelling.  Gastrointestinal: Negative for nausea and vomiting.  Genitourinary: Negative for dysuria.  Musculoskeletal: Negative for joint swelling.  Skin: Negative for rash.  Neurological: Positive for headaches.  Hematological: Does not bruise/bleed easily.  Psychiatric/Behavioral: Negative for dysphoric mood. The patient is not nervous/anxious.        Objective:   Physical Exam        Assessment & Plan:

## 2016-05-09 NOTE — Progress Notes (Signed)
Past Surgical History He  has a past surgical history that includes Cholecystectomy and Vasectomy.  No Known Allergies  Family History His family history includes Breast cancer in his mother; Colon cancer in his maternal grandmother; Diabetes in his mother; Heart attack in his paternal uncle.  Social History He  reports that he has never smoked. He has never used smokeless tobacco. He reports that he drinks about 0.6 oz of alcohol per week . He reports that he does not use drugs.  Review of systems Constitutional: Negative for fever and unexpected weight change.  HENT: Positive for postnasal drip. Negative for congestion, dental problem, ear pain, nosebleeds, rhinorrhea, sinus pressure, sneezing, sore throat and trouble swallowing.   Eyes: Negative for redness and itching.  Respiratory: Negative for cough, chest tightness, shortness of breath and wheezing.   Cardiovascular: Negative for palpitations and leg swelling.  Gastrointestinal: Negative for nausea and vomiting.  Genitourinary: Negative for dysuria.  Musculoskeletal: Negative for joint swelling.  Skin: Negative for rash.  Neurological: Positive for headaches.  Hematological: Does not bruise/bleed easily.  Psychiatric/Behavioral: Negative for dysphoric mood. The patient is not nervous/anxious.     Current Outpatient Prescriptions on File Prior to Visit  Medication Sig  . fluticasone (FLONASE) 50 MCG/ACT nasal spray Place 2 sprays into the nose daily as needed.  Marland Kitchen. ibuprofen (ADVIL,MOTRIN) 200 MG tablet Take 200 mg by mouth as needed.   No current facility-administered medications on file prior to visit.     Chief Complaint  Patient presents with  . sleep consult    per Dr. Clent RidgesFry. no prior sleep study. pt c/o loud snoring, occ daytime sleepiness, restless sleep & stops breathing during sleep (per wife). EPWORTH: 10    Past medical history He  has a past medical history of Allergy.  Vital signs BP 130/80 (BP Location:  Left Arm, Cuff Size: Normal)   Pulse (!) 109   Ht 5\' 9"  (1.753 m)   Wt 279 lb (126.6 kg)   SpO2 96%   BMI 41.20 kg/m   History of Present Illness Mitchell Castillo is a 45 y.o. male for evaluation of sleep problems.  His wife has been concerned about his snoring, and says he stops breathing while asleep  This has been getting worse.  He has trouble sleeping on his back, and dreams frequently.  His mouth gets dry at night.  He has noticed less energy in the afternoon, and can fall asleep easily when a passenger in a car.  He goes to sleep at 11 pm.  He falls asleep in minutes.  He sleeps through the night.  He gets out of bed at 550 am.  He feels okay in the morning.  He denies morning headache.  He does not use anything to help him fall sleep or stay awake.  He denies sleep walking, sleep talking, bruxism, or nightmares.  There is no history of restless legs.  He denies sleep hallucinations, sleep paralysis, or cataplexy.  The Epworth score is 10 out of 24.  He works as a Restaurant manager, fast foodnurse with [employer]  Physical Exam:  General - No distress ENT - No sinus tenderness, no oral exudate, no LAN, no thyromegaly, TM clear, pupils equal/reactive, deviated septum, MP 2, 3+ tonsils Cardiac - s1s2 regular, no murmur, pulses symmetric Chest - No wheeze/rales/dullness, good air entry, normal respiratory excursion Back - No focal tenderness Abd - Soft, non-tender, no organomegaly, + bowel sounds Ext - No edema Neuro - Normal strength, cranial nerves intact  Skin - No rashes Psych - Normal mood, and behavior  Discussion: He has snoring, witnessed apnea, sleep disruption, and daytime sleepiness.  I am concerned he could have sleep apnea.  We discussed how sleep apnea can affect various health problems, including risks for hypertension, cardiovascular disease, and diabetes.  We also discussed how sleep disruption can increase risks for accidents, such as while driving.  Weight loss as a means of improving sleep  apnea was also reviewed.  Additional treatment options discussed were CPAP therapy, oral appliance, and surgical intervention.  Assessment/plan:  Snoring with concern for obstructive sleep apnea. - will arrange for home sleep study  Obesity. - discussed importance of weight loss   Patient Instructions  Will arrange for home sleep study Will call to arrange for follow up after sleep study reviewed     Coralyn HellingVineet Tesean Stump, M.D. Pager [phone] 05/09/2016, 3:31 PM

## 2016-05-13 MED FILL — CIPRODEX OTIC SUSPENSION: 0.3-0.1 | 13 days supply | Qty: 8 | Fill #0

## 2016-05-13 MED FILL — AMOXICILLIN 875 MG TABLET: 875 | 7 days supply | Qty: 14 | Fill #0

## 2016-05-14 ENCOUNTER — Encounter: Payer: Self-pay | Admitting: Family Medicine

## 2016-05-14 NOTE — Telephone Encounter (Signed)
Call in transdermal scopolamine patches to wear every 3 days prn, a pack of 10, 2 refills

## 2016-05-15 MED ORDER — SCOPOLAMINE 1 MG/3DAYS TD PT72: 1.0000 | MEDICATED_PATCH | TRANSDERMAL | 2 refills | Status: DC

## 2016-05-26 DIAGNOSIS — G4733 Obstructive sleep apnea (adult) (pediatric): Secondary | ICD-10-CM | POA: Diagnosis not present

## 2016-05-26 DIAGNOSIS — H35712 Central serous chorioretinopathy, left eye: Secondary | ICD-10-CM | POA: Diagnosis not present

## 2016-05-26 DIAGNOSIS — H33323 Round hole, bilateral: Secondary | ICD-10-CM | POA: Diagnosis not present

## 2016-05-26 DIAGNOSIS — H35412 Lattice degeneration of retina, left eye: Secondary | ICD-10-CM | POA: Diagnosis not present

## 2016-05-29 ENCOUNTER — Encounter: Payer: Self-pay | Admitting: Pulmonary Disease

## 2016-05-29 ENCOUNTER — Telehealth: Payer: Self-pay | Admitting: Pulmonary Disease

## 2016-05-29 ENCOUNTER — Other Ambulatory Visit: Payer: Self-pay | Admitting: *Deleted

## 2016-05-29 DIAGNOSIS — G4733 Obstructive sleep apnea (adult) (pediatric): Secondary | ICD-10-CM

## 2016-05-29 DIAGNOSIS — R0683 Snoring: Secondary | ICD-10-CM

## 2016-05-29 HISTORY — DX: Obstructive sleep apnea (adult) (pediatric): G47.33

## 2016-05-29 NOTE — Telephone Encounter (Signed)
HST 05/26/16 >> AHI 72.3, SaO2 low 70%.   Will have my nurse inform pt that sleep study shows severe sleep apnea.  Options are 1) CPAP now, 2) ROV first.  If He is agreeable to CPAP, then please send order for auto CPAP range 5 to 15 cm H2O with heated humidity and mask of choice.  Have download sent 1 month after starting CPAP and set up ROV 2 months after starting CPAP.

## 2016-05-30 NOTE — Telephone Encounter (Signed)
LMTCB

## 2016-05-30 NOTE — Telephone Encounter (Signed)
Pt aware of results & voiced his understanding. Pt asked that we provide him with DME numbers, I have emailed these to pt. Pt states he would like to call around and do some research on buying a cpap machine out right. Pt will contact us once he has made a decision. Nothing further needed at this time.  Will route to VS just as a [place]

## 2016-06-03 NOTE — Telephone Encounter (Signed)
VS please advise if you have any suggestions on a cpap machine, as patient plans to buy his out right.  Mitchell DownsPaul E Castillo  to Me       9:18 PM  Thx [employer]. I think I've picked out the machine I'm gonna use. I'm going to get it online. I do need some guidance regarding what kind of mask to try. I thought my sleep study may indicate what kind of breathing I was doing .. mouth vs nasal. Also i will need the prescription from Dr. Craige CottaSood. I can place the order online and they can contact the office to get the RX. I think I'm going with the ResMed Airsense 10 Autoset. Any feedback is appreciated. Thx. Renae FicklePaul

## 2016-06-04 NOTE — Telephone Encounter (Signed)
Can arrange for script for ResMed Airsense 10 auto set with pressure range 5 to 20 cm H2O.  Please advise him that he could pick a mask of his choice since there is no one best mask, but rather individual preferences for masks.  Once he has chosen a mask, I can send a prescription for the mask separately.

## 2016-06-05 NOTE — Telephone Encounter (Signed)
Patient state the nasal pillows is the one he is ordering, advised patient VS can arrange for the script to be done for the ResMed Airsense 10 auto set and a separate prescription will be made of the nasal mask. Contact # [phone] .Charm RingsErica R Taylor

## 2016-06-06 ENCOUNTER — Other Ambulatory Visit: Payer: Self-pay

## 2016-06-06 DIAGNOSIS — G4733 Obstructive sleep apnea (adult) (pediatric): Secondary | ICD-10-CM

## 2016-06-10 NOTE — Telephone Encounter (Signed)
Mitchell Castillo, I have placed this RX in VS's outlook on Friday. Pt has requested that we fax it once is it signed. Please advise. Thanks.

## 2016-06-13 NOTE — Telephone Encounter (Signed)
Dr Craige CottaSood do you have anything on this patient?

## 2016-06-18 NOTE — Telephone Encounter (Signed)
I have signed prescription and it is in my office.

## 2016-06-19 NOTE — Telephone Encounter (Signed)
CPAP order faxed to [email or web address] per patients request.  Fax# [phone] Pt notified via MyChart message.  Nothing further needed.

## 2016-08-27 ENCOUNTER — Ambulatory Visit (INDEPENDENT_AMBULATORY_CARE_PROVIDER_SITE_OTHER): Payer: 59 | Admitting: Family Medicine

## 2016-08-27 ENCOUNTER — Encounter: Payer: Self-pay | Admitting: Family Medicine

## 2016-08-27 VITALS — BP 130/96 | HR 80 | Temp 98.3°F | Ht 69.0 in | Wt 252.0 lb

## 2016-08-27 DIAGNOSIS — N410 Acute prostatitis: Secondary | ICD-10-CM

## 2016-08-27 MED ORDER — CIPROFLOXACIN HCL 500 MG PO TABS: 500.0000 mg | ORAL_TABLET | Freq: Two times a day (BID) | ORAL | 0 refills | Status: DC

## 2016-08-27 MED FILL — CIPROFLOXACIN HCL 500 MG TA: 500 | 14 days supply | Qty: 28 | Fill #0

## 2016-08-27 NOTE — Progress Notes (Signed)
Pre visit review using our clinic review tool, if applicable. No additional management support is needed unless otherwise documented below in the visit note. 

## 2016-08-27 NOTE — Patient Instructions (Signed)
WE NOW OFFER   [facility] FAST TRACK!!!  SAME DAY Appointments for ACUTE CARE  Such as: Sprains, Injuries, cuts, abrasions, rashes, muscle pain, joint pain, back pain Colds, flu, sore throats, headache, allergies, cough, fever  Ear pain, sinus and eye infections Abdominal pain, nausea, vomiting, diarrhea, upset stomach Animal/insect bites  3 Easy Ways to Schedule: Walk-In Scheduling Call in scheduling Mychart Sign-up: [email or web address]         

## 2016-08-27 NOTE — Progress Notes (Signed)
   Subjective:    Patient ID: Mitchell Castillo, male    DOB: [birth year], 46 y.o.   MRN: [id number]  HPI Here for what he thinks is a prostate infection. He has had several in the past and he remembers them. For 2 weeks he has had urgency to urinate with a slow stream and pressure in the perirectal area. No fever or burning.    Review of Systems  Constitutional: Negative.   Respiratory: Negative.   Cardiovascular: Negative.   Gastrointestinal: Negative.   Genitourinary: Positive for difficulty urinating and frequency. Negative for dysuria, flank pain, hematuria and testicular pain.       Objective:   Physical Exam  Constitutional: He appears well-developed and well-nourished. No distress.  Abdominal: Soft. Bowel sounds are normal. He exhibits no distension and no mass. There is no tenderness. There is no rebound and no guarding.  Genitourinary:  Genitourinary Comments: Prostate is swollen, boggy, and tender. He cannot produce a urine sample          Assessment & Plan:  Prostatitis, treat with Cipro for 14 days. Recheck prn.  Gershon CraneStephen Kierria Feigenbaum, MD

## 2016-09-21 ENCOUNTER — Encounter: Payer: Self-pay | Admitting: Family Medicine

## 2016-09-22 ENCOUNTER — Other Ambulatory Visit: Payer: Self-pay | Admitting: Family Medicine

## 2016-09-22 MED ORDER — DOXYCYCLINE HYCLATE 100 MG PO TABS: 100.0000 mg | ORAL_TABLET | Freq: Two times a day (BID) | ORAL | 0 refills | Status: DC

## 2016-09-22 MED FILL — DOXYCYCLINE HYCLATE 100 MG: 100 | 10 days supply | Qty: 20 | Fill #0

## 2016-09-22 NOTE — Telephone Encounter (Signed)
Stop the Cipro and call in Doxycycline 100 mg bid for 10 days. Recheck if this does not help

## 2016-09-22 NOTE — Telephone Encounter (Signed)
I spoke with pt, sent new script e-scribe to [facility] pharmacy and removed Cipro from current medication list.

## 2016-10-20 ENCOUNTER — Ambulatory Visit (INDEPENDENT_AMBULATORY_CARE_PROVIDER_SITE_OTHER): Payer: 59 | Admitting: Family Medicine

## 2016-10-20 ENCOUNTER — Encounter: Payer: Self-pay | Admitting: Family Medicine

## 2016-10-20 VITALS — BP 136/88 | HR 71 | Temp 98.4°F | Ht 69.0 in | Wt 242.0 lb

## 2016-10-20 DIAGNOSIS — N411 Chronic prostatitis: Secondary | ICD-10-CM

## 2016-10-20 NOTE — Progress Notes (Signed)
   Subjective:    Patient ID: Mitchell Castillo, male    DOB: 12-19-1970, 46 y.o.   MRN: [id number]  HPI Here to follow up on urgency to urinate and mild discomfort with urinating. No fever or back pain. Since March he has taken a course of Cipro and a course of Doxycycline, and neither one seemed to help.    Review of Systems  Constitutional: Negative.   Respiratory: Negative.   Cardiovascular: Negative.   Gastrointestinal: Negative.   Genitourinary: Positive for difficulty urinating, frequency and urgency. Negative for hematuria and testicular pain.       Objective:   Physical Exam  Constitutional: He appears well-developed and well-nourished.  Cardiovascular: Normal rate, regular rhythm, normal heart sounds and intact distal pulses.   Pulmonary/Chest: Effort normal and breath sounds normal.  Abdominal: Soft. Bowel sounds are normal. He exhibits no distension and no mass. There is no tenderness. There is no rebound and no guarding.          Assessment & Plan:  Prostatitis, refer to Urology. Drink plenty of water.  Gershon CraneStephen Rinda Rollyson, MD

## 2016-10-20 NOTE — Patient Instructions (Signed)
WE NOW OFFER   [facility] FAST TRACK!!!  SAME DAY Appointments for ACUTE CARE  Such as: Sprains, Injuries, cuts, abrasions, rashes, muscle pain, joint pain, back pain Colds, flu, sore throats, headache, allergies, cough, fever  Ear pain, sinus and eye infections Abdominal pain, nausea, vomiting, diarrhea, upset stomach Animal/insect bites  3 Easy Ways to Schedule: Walk-In Scheduling Call in scheduling Mychart Sign-up: [email or web address]         

## 2016-10-28 ENCOUNTER — Encounter: Payer: Self-pay | Admitting: Family Medicine

## 2016-11-04 ENCOUNTER — Other Ambulatory Visit: Payer: Self-pay | Admitting: Family Medicine

## 2016-11-04 ENCOUNTER — Encounter: Payer: Self-pay | Admitting: Family Medicine

## 2016-11-04 MED ORDER — SULFAMETHOXAZOLE-TRIMETHOPRIM 800-160 MG PO TABS: 1.0000 | ORAL_TABLET | Freq: Two times a day (BID) | ORAL | 0 refills | Status: DC

## 2016-11-04 NOTE — Telephone Encounter (Signed)
Tell him to keep the appt with [facility] (we can always cancel later if needed) but in the meantime try Bactrim DS bid, call in #60 wit no rf

## 2016-12-16 DIAGNOSIS — N411 Chronic prostatitis: Secondary | ICD-10-CM | POA: Diagnosis not present

## 2016-12-16 DIAGNOSIS — N486 Induration penis plastica: Secondary | ICD-10-CM | POA: Diagnosis not present

## 2016-12-16 MED FILL — SULFAMETHOXAZOLE/TMP DS TAB: 800-160 | 30 days supply | Qty: 60 | Fill #0

## 2017-02-26 ENCOUNTER — Encounter: Payer: Self-pay | Admitting: Family Medicine

## 2017-05-12 ENCOUNTER — Encounter: Payer: Self-pay | Admitting: Family Medicine

## 2017-08-03 ENCOUNTER — Encounter: Payer: Self-pay | Admitting: Family Medicine

## 2017-08-05 ENCOUNTER — Encounter: Payer: Self-pay | Admitting: Family Medicine

## 2017-08-05 ENCOUNTER — Ambulatory Visit (INDEPENDENT_AMBULATORY_CARE_PROVIDER_SITE_OTHER): Payer: 59 | Admitting: Family Medicine

## 2017-08-05 VITALS — BP 140/82 | HR 88 | Wt 244.6 lb

## 2017-08-05 DIAGNOSIS — N419 Inflammatory disease of prostate, unspecified: Secondary | ICD-10-CM

## 2017-08-05 LAB — POCT URINALYSIS DIPSTICK
Bilirubin, UA: NEGATIVE
Blood, UA: NEGATIVE
Glucose, UA: NEGATIVE
Ketones, UA: NEGATIVE
Leukocytes, UA: NEGATIVE
Nitrite, UA: NEGATIVE
Protein, UA: NEGATIVE
Spec Grav, UA: 1.015 (ref 1.010–1.025)
Urobilinogen, UA: 0.2 E.U./dL
pH, UA: 6 (ref 5.0–8.0)

## 2017-08-05 MED ORDER — SULFAMETHOXAZOLE-TRIMETHOPRIM 800-160 MG PO TABS: 1.0000 | ORAL_TABLET | Freq: Two times a day (BID) | ORAL | 1 refills | Status: DC

## 2017-08-05 MED FILL — SULFAMETHOXAZOLE-TMP DS TAB: 800-160 | 30 days supply | Qty: 60 | Fill #0

## 2017-08-05 NOTE — Progress Notes (Signed)
   Subjective:    Patient ID: Mitchell Castillo, male    DOB: 29-Dec-1970, 47 y.o.   MRN: [id number]  HPI Here for one week of urgency to urinate and dribbling at the end of urinating. No burning or DC. He had several prostate infections last year and this reminds him of the same symptoms.    Review of Systems  Constitutional: Negative.   Respiratory: Negative.   Cardiovascular: Negative.   Gastrointestinal: Negative.   Genitourinary: Positive for urgency. Negative for dysuria and frequency.       Objective:   Physical Exam  Constitutional: He is oriented to person, place, and time. He appears well-developed and well-nourished.  Cardiovascular: Normal rate, regular rhythm, normal heart sounds and intact distal pulses.  Pulmonary/Chest: Effort normal and breath sounds normal. No respiratory distress. He has no wheezes. He has no rales.  Neurological: He is alert and oriented to person, place, and time.          Assessment & Plan:  Recurrent prostatitis, treat with 60 days of Bactrim DS bid. Gershon CraneStephen Dolorez Jeffrey, MD

## 2017-09-07 MED FILL — SULFAMETHOXAZOLE-TMP DS TAB: 800-160 | 30 days supply | Qty: 60 | Fill #1

## 2017-09-16 ENCOUNTER — Ambulatory Visit (INDEPENDENT_AMBULATORY_CARE_PROVIDER_SITE_OTHER): Payer: 59 | Admitting: Family Medicine

## 2017-09-16 ENCOUNTER — Encounter: Payer: Self-pay | Admitting: Family Medicine

## 2017-09-16 VITALS — BP 118/78 | HR 87 | Temp 98.4°F | Ht 68.0 in | Wt 243.2 lb

## 2017-09-16 DIAGNOSIS — R945 Abnormal results of liver function studies: Secondary | ICD-10-CM | POA: Diagnosis not present

## 2017-09-16 DIAGNOSIS — Z0000 Encounter for general adult medical examination without abnormal findings: Secondary | ICD-10-CM | POA: Diagnosis not present

## 2017-09-16 LAB — CBC WITH DIFFERENTIAL/PLATELET
Basophils Absolute: 0.1 10*3/uL (ref 0.0–0.1)
Basophils Relative: 1.9 % (ref 0.0–3.0)
Eosinophils Absolute: 0.1 10*3/uL (ref 0.0–0.7)
Eosinophils Relative: 2.4 % (ref 0.0–5.0)
HCT: 50.9 % (ref 39.0–52.0)
Hemoglobin: 17.1 g/dL — ABNORMAL HIGH (ref 13.0–17.0)
Lymphocytes Relative: 20.5 % (ref 12.0–46.0)
Lymphs Abs: 1.3 10*3/uL (ref 0.7–4.0)
MCHC: 33.6 g/dL (ref 30.0–36.0)
MCV: 92.9 fl (ref 78.0–100.0)
Monocytes Absolute: 0.9 10*3/uL (ref 0.1–1.0)
Monocytes Relative: 14.6 % — ABNORMAL HIGH (ref 3.0–12.0)
Neutro Abs: 3.7 10*3/uL (ref 1.4–7.7)
Neutrophils Relative %: 60.6 % (ref 43.0–77.0)
Platelets: 240 10*3/uL (ref 150.0–400.0)
RBC: 5.48 Mil/uL (ref 4.22–5.81)
RDW: 14.1 % (ref 11.5–15.5)
WBC: 6.1 10*3/uL (ref 4.0–10.5)

## 2017-09-16 LAB — LIPID PANEL
Cholesterol: 212 mg/dL — ABNORMAL HIGH (ref 0–200)
HDL: 60.2 mg/dL (ref >39.00)
LDL Cholesterol: 124 mg/dL — ABNORMAL HIGH (ref 0–99)
NonHDL: 152.08
Total CHOL/HDL Ratio: 4
Triglycerides: 138 mg/dL (ref 0.0–149.0)
VLDL: 27.6 mg/dL (ref 0.0–40.0)

## 2017-09-16 LAB — HEPATIC FUNCTION PANEL
ALT: 277 U/L — ABNORMAL HIGH (ref 0–53)
AST: 111 U/L — ABNORMAL HIGH (ref 0–37)
Albumin: 4.7 g/dL (ref 3.5–5.2)
Alkaline Phosphatase: 150 U/L — ABNORMAL HIGH (ref 39–117)
Bilirubin, Direct: 0.3 mg/dL (ref 0.0–0.3)
Total Bilirubin: 1.3 mg/dL — ABNORMAL HIGH (ref 0.2–1.2)
Total Protein: 7.7 g/dL (ref 6.0–8.3)

## 2017-09-16 LAB — BASIC METABOLIC PANEL
BUN: 15 mg/dL (ref 6–23)
CO2: 27 mEq/L (ref 19–32)
Calcium: 9.6 mg/dL (ref 8.4–10.5)
Chloride: 102 mEq/L (ref 96–112)
Creatinine, Ser: 1.12 mg/dL (ref 0.40–1.50)
GFR: 74.33 mL/min (ref >60.00)
Glucose, Bld: 79 mg/dL (ref 70–99)
Potassium: 4.4 mEq/L (ref 3.5–5.1)
Sodium: 137 mEq/L (ref 135–145)

## 2017-09-16 LAB — POC URINALSYSI DIPSTICK (AUTOMATED)
Bilirubin, UA: NEGATIVE
Blood, UA: NEGATIVE
Glucose, UA: NEGATIVE
Ketones, UA: NEGATIVE
Leukocytes, UA: NEGATIVE
Nitrite, UA: NEGATIVE
Protein, UA: NEGATIVE
Spec Grav, UA: >=1.03 — AB (ref 1.010–1.025)
Urobilinogen, UA: 1 E.U./dL
pH, UA: 6 (ref 5.0–8.0)

## 2017-09-16 LAB — TSH: TSH: 1.93 u[IU]/mL (ref 0.35–4.50)

## 2017-09-16 MED ORDER — BETAMETHASONE DIPROPIONATE 0.05 % EX CREA: TOPICAL_CREAM | Freq: Two times a day (BID) | CUTANEOUS | 5 refills | Status: DC

## 2017-09-16 MED FILL — BETAMETHASONE DP 0.05% CRM: 0.05 | 7 days supply | Qty: 45 | Fill #0

## 2017-09-16 NOTE — Progress Notes (Signed)
   Subjective:    Patient ID: Mitchell Castillo, male    DOB: [birth year], 47 y.o.   MRN: [id number]  HPI Here for a well exam. He feels fine. He is currently taking a 60 day course of Bactrim DS for prostatitis, and he is symptom free.    Review of Systems  Constitutional: Negative.   HENT: Negative.   Eyes: Negative.   Respiratory: Negative.   Cardiovascular: Negative.   Gastrointestinal: Negative.   Genitourinary: Negative.   Musculoskeletal: Negative.   Skin: Negative.   Neurological: Negative.   Psychiatric/Behavioral: Negative.        Objective:   Physical Exam  Constitutional: He is oriented to person, place, and time. He appears well-developed and well-nourished. No distress.  HENT:  Head: Normocephalic and atraumatic.  Right Ear: External ear normal.  Left Ear: External ear normal.  Nose: Nose normal.  Mouth/Throat: Oropharynx is clear and moist. No oropharyngeal exudate.  Eyes: Pupils are equal, round, and reactive to light. Conjunctivae and EOM are normal. Right eye exhibits no discharge. Left eye exhibits no discharge. No scleral icterus.  Neck: Neck supple. No JVD present. No tracheal deviation present. No thyromegaly present.  Cardiovascular: Normal rate, regular rhythm, normal heart sounds and intact distal pulses. Exam reveals no gallop and no friction rub.  No murmur heard. Pulmonary/Chest: Effort normal and breath sounds normal. No respiratory distress. He has no wheezes. He has no rales. He exhibits no tenderness.  Abdominal: Soft. Bowel sounds are normal. He exhibits no distension and no mass. There is no tenderness. There is no rebound and no guarding.  Genitourinary: Rectum normal, prostate normal and penis normal. Rectal exam shows guaiac negative stool. No penile tenderness.  Musculoskeletal: Normal range of motion. He exhibits no edema or tenderness.  Lymphadenopathy:    He has no cervical adenopathy.  Neurological: He is alert and oriented to person,  place, and time. He has normal reflexes. No cranial nerve deficit. He exhibits normal muscle tone. Coordination normal.  Skin: Skin is warm and dry. No rash noted. He is not diaphoretic. No erythema. No pallor.  Psychiatric: He has a normal mood and affect. His behavior is normal. Judgment and thought content normal.          Assessment & Plan:  Well exam. We discussed diet and exercise. Get fasting labs.  Gershon CraneStephen Dian Laprade, MD

## 2017-09-22 ENCOUNTER — Encounter: Payer: Self-pay | Admitting: Family Medicine

## 2017-09-22 ENCOUNTER — Telehealth: Payer: Self-pay | Admitting: Family Medicine

## 2017-09-22 NOTE — Addendum Note (Signed)
Addended by: Gershon CraneFRY, Avryl Roehm A on: 09/22/2017 08:23 AM   Modules accepted: Orders

## 2017-09-22 NOTE — Telephone Encounter (Signed)
Copied from CRM [id number]. Topic: General - Other >> Sep 22, 2017  3:17 PM Debroah LoopLander, Lumin L wrote: Reason for CRM: Patient would like a call back from Dr. Abran CantorFrye RE labs from 04/10 in regard to CT that was scheduled. It was scheduled without consulting him and he will be out of town, but before he changes it he'd like to discuss with Dr. Abran CantorFrye. Advised to allow up to 2 business days.

## 2017-09-23 ENCOUNTER — Encounter: Payer: Self-pay | Admitting: Family Medicine

## 2017-09-23 NOTE — Telephone Encounter (Signed)
Noted  

## 2017-09-23 NOTE — Telephone Encounter (Signed)
Called and spoke with pt. Pt advised and voiced understanding.  

## 2017-09-23 NOTE — Telephone Encounter (Signed)
The first thing is to get a good look at his liver, and the CT scan is scheduled for 09-25-17. We will make further plans based on these results

## 2017-09-23 NOTE — Telephone Encounter (Signed)
Called and spoke with pt. Pt advised of his lab results and voiced understanding. Pt stated that he had been on bacterium for 6 weeks, and had been taking tylenol 500 mg daily along with occasionally drink wine with dinner this might have been a reason for his elevated levels.   Pt is scheduled for his CT this Friday.  Pt wanted to know if Dr. Clent RidgesFry would like for him to come back in a few months to recheck labs again to follow up? Since he has stopped taking bacterium, tylenol, and is NOT drinking wine.   Sent to PCP

## 2017-09-23 NOTE — Telephone Encounter (Signed)
That sounds okay. It the CT scan looks okay, we may simply recheck the enzymes in a month to follow up

## 2017-09-25 ENCOUNTER — Ambulatory Visit (INDEPENDENT_AMBULATORY_CARE_PROVIDER_SITE_OTHER)
Admission: RE | Admit: 2017-09-25 | Discharge: 2017-09-25 | Disposition: A | Discharge status: Home / Self Care | Payer: 59 | Source: Ambulatory Visit | Attending: Family Medicine | Admitting: Family Medicine

## 2017-09-25 DIAGNOSIS — R945 Abnormal results of liver function studies: Secondary | ICD-10-CM | POA: Diagnosis not present

## 2017-09-25 DIAGNOSIS — R7989 Other specified abnormal findings of blood chemistry: Secondary | ICD-10-CM | POA: Diagnosis not present

## 2017-09-25 MED ORDER — IOPAMIDOL (ISOVUE-300) INJECTION 61%
100.0000 mL | Freq: Once | INTRAVENOUS | Status: AC | PRN
  Administered 2017-09-25: 100 mL via INTRAVENOUS

## 2017-09-28 ENCOUNTER — Telehealth: Payer: Self-pay | Admitting: Family Medicine

## 2017-09-28 NOTE — Addendum Note (Signed)
Addended by: Gershon CraneFRY, Clennon Nasca A on: 09/28/2017 07:47 AM   Modules accepted: Orders

## 2017-09-28 NOTE — Telephone Encounter (Signed)
Copied from CRM [id number]. Topic: General - Other >> Sep 22, 2017  3:17 PM Debroah LoopLander, Lumin L wrote: Reason for CRM: Patient would like a call back from Dr. Abran CantorFrye RE labs from 04/10 in regard to CT that was scheduled. It was scheduled without consulting him and he will be out of town, but before he changes it he'd like to discuss with Dr. Abran CantorFrye. Advised to allow up to 2 business days.  >> Sep 28, 2017 11:05 AM Rudi CocoLathan, Timber Lucarelli M, NT wrote: Pt. Calling to see if he can have results of his  Labs (09/16/17)and ct scan [facility] Fax # [phone]

## 2017-09-28 NOTE — Telephone Encounter (Signed)
Sent to [facility] did you already fax his lab results when doing the referral?  if NOT I will.   Thanks DanielsonShelby

## 2017-09-28 NOTE — Telephone Encounter (Signed)
Patient has already had CT scan and been notified of results. He should just need results sent to Dr. Loreta AveMann.

## 2017-10-01 ENCOUNTER — Other Ambulatory Visit: Payer: 59

## 2017-10-06 DIAGNOSIS — Z7689 Persons encountering health services in other specified circumstances: Secondary | ICD-10-CM | POA: Diagnosis not present

## 2017-10-06 DIAGNOSIS — R197 Diarrhea, unspecified: Secondary | ICD-10-CM | POA: Diagnosis not present

## 2017-10-06 DIAGNOSIS — Z800 Family history of malignant neoplasm of digestive organs: Secondary | ICD-10-CM | POA: Diagnosis not present

## 2017-10-06 DIAGNOSIS — K429 Umbilical hernia without obstruction or gangrene: Secondary | ICD-10-CM | POA: Diagnosis not present

## 2017-10-06 DIAGNOSIS — K7689 Other specified diseases of liver: Secondary | ICD-10-CM | POA: Diagnosis not present

## 2017-10-06 LAB — HEPATIC FUNCTION PANEL
ALT: 36 (ref 10–40)
AST: 24 (ref 14–40)
Alkaline Phosphatase: 94 (ref 25–125)
Bilirubin, Total: 0.4

## 2017-10-19 ENCOUNTER — Encounter: Payer: Self-pay | Admitting: Family Medicine

## 2017-10-19 NOTE — Telephone Encounter (Signed)
Come in for another OV so we can check a urine sample etc

## 2017-10-21 NOTE — Telephone Encounter (Signed)
Tell him I would recommend another round of antibiotics. Call in Bactrim DS to take bid, #60 with one rf

## 2017-10-22 ENCOUNTER — Encounter: Payer: Self-pay | Admitting: Family Medicine

## 2017-10-22 MED ORDER — SULFAMETHOXAZOLE-TRIMETHOPRIM 800-160 MG PO TABS: 1.0000 | ORAL_TABLET | Freq: Two times a day (BID) | ORAL | 0 refills | Status: DC

## 2017-10-22 MED FILL — SULFAMETHOXAZOLE-TMP DS TAB: 800-160 | 30 days supply | Qty: 60 | Fill #0

## 2017-10-25 ENCOUNTER — Encounter: Payer: Self-pay | Admitting: Family Medicine

## 2018-09-21 ENCOUNTER — Telehealth: Payer: 59 | Admitting: Physician Assistant

## 2018-09-21 DIAGNOSIS — M545 Low back pain, unspecified: Secondary | ICD-10-CM

## 2018-09-21 MED ORDER — CYCLOBENZAPRINE HCL 10 MG PO TABS: 5.0000 mg | ORAL_TABLET | Freq: Three times a day (TID) | ORAL | 0 refills | Status: DC | PRN

## 2018-09-21 MED FILL — CYCLOBENZAPRINE HCL 10 MG T: 10 | 10 days supply | Qty: 30 | Fill #0
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
## 2018-09-22 ENCOUNTER — Encounter: Payer: Self-pay | Admitting: Family Medicine

## 2018-09-22 ENCOUNTER — Other Ambulatory Visit: Payer: Self-pay

## 2018-09-22 ENCOUNTER — Ambulatory Visit (INDEPENDENT_AMBULATORY_CARE_PROVIDER_SITE_OTHER): Payer: 59 | Admitting: Family Medicine

## 2018-09-22 DIAGNOSIS — S39012D Strain of muscle, fascia and tendon of lower back, subsequent encounter: Secondary | ICD-10-CM

## 2018-09-22 MED ORDER — TRAMADOL HCL 50 MG PO TABS: 100.0000 mg | ORAL_TABLET | Freq: Four times a day (QID) | ORAL | 0 refills | Status: DC | PRN

## 2018-09-22 MED ORDER — METHYLPREDNISOLONE 4 MG PO TBPK: ORAL_TABLET | ORAL | 0 refills | Status: DC

## 2018-09-22 MED FILL — traMADol HCL 50 MG TABS: 50 | 8 days supply | Qty: 60 | Fill #0

## 2018-09-22 MED FILL — METHYLPREDNISOLONE 4 MG TBP: 4 | 6 days supply | Qty: 21 | Fill #0
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
## 2018-09-27 ENCOUNTER — Encounter: Payer: Self-pay | Admitting: Family Medicine

## 2018-09-27 NOTE — Telephone Encounter (Signed)
Dr. Clent Ridges please advise if we need to schedule a virtual follow up visit for this.  [employer] will be sending over FMLA papers for the pt due to this issue since he is not able to return to work yet.

## 2018-09-27 NOTE — Telephone Encounter (Signed)
I understand. Let's shoot for him to return to work next week

## 2018-09-28 ENCOUNTER — Ambulatory Visit (INDEPENDENT_AMBULATORY_CARE_PROVIDER_SITE_OTHER): Payer: 59 | Admitting: Family Medicine

## 2018-09-28 ENCOUNTER — Other Ambulatory Visit: Payer: Self-pay

## 2018-09-28 ENCOUNTER — Encounter: Payer: Self-pay | Admitting: Family Medicine

## 2018-09-28 DIAGNOSIS — S39012D Strain of muscle, fascia and tendon of lower back, subsequent encounter: Secondary | ICD-10-CM

## 2018-09-28 MED ORDER — TRAMADOL HCL 50 MG PO TABS: 100.0000 mg | ORAL_TABLET | Freq: Four times a day (QID) | ORAL | 2 refills | Status: DC | PRN

## 2018-09-28 MED ORDER — DICLOFENAC SODIUM 75 MG PO TBEC: 75.0000 mg | DELAYED_RELEASE_TABLET | Freq: Two times a day (BID) | ORAL | 2 refills | Status: DC

## 2018-09-28 MED ORDER — CYCLOBENZAPRINE HCL 10 MG PO TABS: 5.0000 mg | ORAL_TABLET | Freq: Three times a day (TID) | ORAL | 5 refills | Status: DC | PRN

## 2018-09-28 MED FILL — traMADol HCL 50 MG TABS: 50 | 8 days supply | Qty: 60 | Fill #0

## 2018-09-28 MED FILL — DICLOFENAC SODIUM 75 MG TAB: 75 | 30 days supply | Qty: 60 | Fill #0

## 2018-09-28 MED FILL — CYCLOBENZAPRINE HCL 10 MG T: 10 | 20 days supply | Qty: 60 | Fill #0
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
## 2018-09-30 DIAGNOSIS — M545 Low back pain: Secondary | ICD-10-CM | POA: Diagnosis not present

## 2018-10-03 ENCOUNTER — Encounter: Payer: Self-pay | Admitting: Family Medicine

## 2018-10-04 ENCOUNTER — Encounter: Payer: Self-pay | Admitting: Family Medicine

## 2018-10-04 ENCOUNTER — Telehealth: Payer: Self-pay | Admitting: *Deleted

## 2018-10-04 ENCOUNTER — Ambulatory Visit (INDEPENDENT_AMBULATORY_CARE_PROVIDER_SITE_OTHER): Payer: 59 | Admitting: Family Medicine

## 2018-10-04 ENCOUNTER — Other Ambulatory Visit: Payer: Self-pay

## 2018-10-04 ENCOUNTER — Telehealth: Payer: Self-pay | Admitting: Family Medicine

## 2018-10-04 VITALS — BP 144/98 | HR 110 | Temp 98.1°F | Wt 259.4 lb

## 2018-10-04 DIAGNOSIS — B029 Zoster without complications: Secondary | ICD-10-CM

## 2018-10-04 MED ORDER — VALACYCLOVIR HCL 1 G PO TABS: 1000.0000 mg | ORAL_TABLET | Freq: Three times a day (TID) | ORAL | 0 refills | Status: DC

## 2018-10-04 MED ORDER — GABAPENTIN 100 MG PO CAPS
100.0000 mg | ORAL_CAPSULE | Freq: Three times a day (TID) | ORAL | 3 refills | Status: DC
Start: 2018-10-04 — End: 2018-10-08

## 2018-10-04 MED FILL — valACYclovir HCL 1 GM TABS: 1 | 10 days supply | Qty: 30 | Fill #0

## 2018-10-04 MED FILL — GABAPENTIN 100 MG CAPSULE: 100 | 30 days supply | Qty: 90 | Fill #0

## 2018-10-04 NOTE — Telephone Encounter (Signed)
Make him an OV for today

## 2018-10-04 NOTE — Telephone Encounter (Signed)
The patient had [employer] send forms to be completed   Fax: [phone]  Disposition: [facility]  Patient has an appointment today

## 2018-10-04 NOTE — Telephone Encounter (Signed)
Dr. Fry please advise. Thanks  

## 2018-10-04 NOTE — Telephone Encounter (Signed)
Copied from CRM [id number]. Topic: Appointment Scheduling - Scheduling Inquiry for Clinic >> Oct 04, 2018  7:06 AM Crist Infante wrote: Reason for CRM: pt would like appt to see Dr Clent Ridges today asap.  Pt has done 2 virtual visits and states he is just not better.  Pt states he thinks he has the shingles and would be better if Dr Clent Ridges could take a look at what he has.  Pt has rash on his side and blisters on his knee.  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 Will do a virtual visit, but really hopes Dr Clent Ridges will consider seeing hi in the office to take a look.

## 2018-10-04 NOTE — Telephone Encounter (Signed)
We can see him OV today

## 2018-10-04 NOTE — Progress Notes (Signed)
   Subjective:    Patient ID: Mitchell Castillo, male    DOB: 1971-04-04, 48 y.o.   MRN: [id number]  HPI Here for 2 weeks of severe burning pain in the left lower back that radiates down the lateral left leg. No recent trauma. No numbness or weakness of the leg. He has tried heat, ice, a Medrol dose pack, Diclofenac, Tramadol, and Flexeril, but nothing has helped the pain. He was referred to PT and had one session, which has not helped. Then yesterday he noticed in the shower that he has some scattered blisters all the way down the lateral part of his left leg, and this morning he could see in the mirror that the blisters involved the left lower back and buttock as well. These are very tender to touch. He had shingles once 5 years ago, and he says the blisters remind him of that time.    Review of Systems  Constitutional: Negative.   Respiratory: Negative.   Cardiovascular: Negative.   Musculoskeletal: Positive for myalgias.  Neurological: Negative for weakness and numbness.       Objective:   Physical Exam Constitutional:      Appearance: Normal appearance.  Cardiovascular:     Rate and Rhythm: Normal rate and regular rhythm.     Pulses: Normal pulses.     Heart sounds: Normal heart sounds.  Pulmonary:     Effort: Pulmonary effort is normal.     Breath sounds: Normal breath sounds.  Musculoskeletal:     Comments: The lower back is not tender and ROM is full   Skin:    Comments: He has a band of scattered vesicles and red papules from the left lower back down the lateral left leg to the ankle   Neurological:     Mental Status: He is alert.           Assessment & Plan:  This pain has been due to shingles the entire time, and this explains his lack of response to the measures tried. He will continue with Tramadol prn. Given Valtrex 1000 mg TID for 10 days, also Gabapentin 100 mg TID. Recheck prn. His symptoms began on 09-20-18 and he has been out of work since 09-22-18. Paperwork was  filled out for [employer] and short term disability. He will plan to return to work on 10-12-18.  Gershon Crane, MD

## 2018-10-04 NOTE — Telephone Encounter (Signed)
appt scheduled for this afternoon for the pt.

## 2018-10-08 ENCOUNTER — Encounter: Payer: Self-pay | Admitting: Family Medicine

## 2018-10-08 MED ORDER — GABAPENTIN 300 MG PO CAPS: 300.0000 mg | ORAL_CAPSULE | Freq: Three times a day (TID) | ORAL | 3 refills | Status: DC

## 2018-10-08 MED FILL — GABAPENTIN 300 MG CAPSULE: 300 | 30 days supply | Qty: 90 | Fill #0

## 2018-10-08 NOTE — Telephone Encounter (Signed)
Yes, tell him to increase this to 300 mg TID. I will send in a new rx for this

## 2018-11-30 MED FILL — DICLOFENAC SODIUM 75 MG TAB: 75 | 30 days supply | Qty: 60 | Fill #1

## 2018-11-30 MED FILL — GABAPENTIN 100 MG CAPSULE: 100 | 30 days supply | Qty: 90 | Fill #1

## 2018-12-23 MED FILL — GABAPENTIN 300 MG CAPSULE: 300 | 30 days supply | Qty: 90 | Fill #1

## 2019-02-01 MED FILL — GABAPENTIN 100 MG CAPSULE: 100 | 30 days supply | Qty: 90 | Fill #2

## 2019-02-20 MED FILL — DICLOFENAC SODIUM 75 MG TAB: 75 | 30 days supply | Qty: 60 | Fill #2

## 2019-03-13 MED FILL — GABAPENTIN 100 MG CAPSULE: 100 | 30 days supply | Qty: 90 | Fill #3

## 2019-03-26 ENCOUNTER — Ambulatory Visit (INDEPENDENT_AMBULATORY_CARE_PROVIDER_SITE_OTHER)
Admission: RE | Admit: 2019-03-26 | Discharge: 2019-03-26 | Disposition: A | Discharge status: Home / Self Care | Payer: 59 | Source: Ambulatory Visit

## 2019-03-26 DIAGNOSIS — B029 Zoster without complications: Secondary | ICD-10-CM

## 2019-03-26 MED ORDER — VALACYCLOVIR HCL 1 G PO TABS: 1000.0000 mg | ORAL_TABLET | Freq: Three times a day (TID) | ORAL | 0 refills | Status: AC

## 2019-03-26 MED ORDER — PREDNISONE 50 MG PO TABS: 50.0000 mg | ORAL_TABLET | Freq: Every day | ORAL | 0 refills | Status: DC

## 2019-03-26 MED ORDER — TRAMADOL HCL 50 MG PO TABS: 50.0000 mg | ORAL_TABLET | Freq: Four times a day (QID) | ORAL | 0 refills | Status: DC | PRN

## 2019-03-26 NOTE — Discharge Instructions (Signed)
As discussed, will treat for shingles at this time due to distribution of rash and symptoms. Start valtrex as directed. You can apply betamethasone cream to lesion in case of other causes such as irritant/allergic contact rash. You can start prednisone to help with pain, itching, burning sensation. Add on tramadol if needed. Continue to monitor rash, if spreading to ear/eye/nose, will need to be seen in person. Follow up with PCP in 1 week for reevaluation if rash has not resolved.   If any questions, feel free to call [phone]

## 2019-03-26 NOTE — ED Provider Notes (Signed)
Virtual Visit via Video Note:  Mitchell Castillo  initiated request for Telemedicine visit with [facility] team. I connected with STAN CANTAVE  on 03/26/2019 at 8:20 AM  for a synchronized telemedicine visit using a video enabled HIPPA compliant telemedicine application. I verified that I am speaking with Venancio Poisson  using two identifiers. Perseus Westall Jodell Cipro, PA-C  was physically located in a [facility] Urgent care site and SRIJAN GIVAN was located at a different location.   The limitations of evaluation and management by telemedicine as well as the availability of in-person appointments were discussed. Patient was informed that he  may incur a bill ( including co-pay) for this virtual visit encounter. Dolphus Linch O'Neal  expressed understanding and gave verbal consent to proceed with virtual visit.     History of Present Illness:Mitchell Castillo  is a 48 y.o. male presents with 2-day history of rash to the left neck/shoulder.  States symptoms for started yesterday with few rash, that has now spread to the shoulder.  Area is itching, burning, and reminds him of previous shingle episodes.  States has noticed some twitching around the surrounding muscles, which is also similar to previous shingles episode.  He denies any URI symptoms such as cough, congestion, sore throat.  Denies fever, chills, body aches.  Denies any hygiene product changes.  He does usually wear a necklace to location of rash, denies any changes to the necklace.  Patient declines rash spreading to the face/ear.  Denies numbness tingling of the face, changes in hearing, changes in taste.  Patient states he has not been taking gabapentin for postherpetic neuralgia from last single episode 6 to 7 months ago.  At that time, it was his second episode of herpes zoster.  States he has noticed that extreme stress is a trigger.  He works in [employer], and transports Covid patients daily, and thinks this may have triggered current  episode.   Past Medical History:  Diagnosis Date  . Allergy   . OSA (obstructive sleep apnea) 05/29/2016    No Known Allergies      Observations/Objective: General: Well appearing, nontoxic, no acute distress. Sitting comfortably. Head: Normocephalic, atraumatic Eye: No conjunctival injection, eyelid swelling. EOMI. No rashes seen to surrounding eye.  ENT: Mucus membranes moist, no lip cracking. No obvious nasal drainage. No rashes to the nose/ ear. Pulm: Speaking in full sentences without difficulty. Normal effort. No respiratory distress, accessory muscle use. Skin: Exam limited due to video visit.  Erythematous lesions to the neck/anterior chest following C4 dermatome.  No rashes seen to the back, ear, face.  Patient and spouse describes rash as hard raised lesions, no obvious vesicles.  No warmth or tenderness to palpation. MSK: Exam limited due to video visit.  Full range of motion of neck, upper extremity.  No obvious changes in strength per patient. Neuro: Normal mental status. Alert and oriented x 3.   Assessment and Plan: Discussed with patient, description of rash not usually indicative of herpes zoster.  However, given history of herpes zoster, rash following dermatome with burning sensation/itching, will treat for herpes zoster with Valtrex.  Will provide symptomatic treatment with prednisone, which will also cover for any contact dermatitis that may be causing symptoms as well.  Tramadol as needed.  Strict return precautions given.  Otherwise follow-up with PCP within the week for reevaluation needed.  Patient expresses understanding and agrees to plan.  Follow Up Instructions:    I discussed  the assessment and treatment plan with the patient. The patient was provided an opportunity to ask questions and all were answered. The patient agreed with the plan and demonstrated an understanding of the instructions.   The patient was advised to call back or seek an in-person  evaluation if the symptoms worsen or if the condition fails to improve as anticipated.  I provided 15 minutes of non-face-to-face time during this encounter.    Belinda Fisher, PA-C  03/26/2019 8:20 AM         Belinda Fisher, PA-C 03/26/19 [phone]

## 2019-07-11 ENCOUNTER — Encounter: Payer: Self-pay | Admitting: Family Medicine

## 2019-07-11 DIAGNOSIS — L989 Disorder of the skin and subcutaneous tissue, unspecified: Secondary | ICD-10-CM

## 2019-07-11 DIAGNOSIS — Z0000 Encounter for general adult medical examination without abnormal findings: Secondary | ICD-10-CM

## 2019-07-12 NOTE — Telephone Encounter (Signed)
Both referrals were done  

## 2019-07-12 NOTE — Telephone Encounter (Signed)
Ok for referrals  

## 2019-08-06 ENCOUNTER — Telehealth: Payer: 59 | Admitting: Physician Assistant

## 2019-08-06 DIAGNOSIS — J0110 Acute frontal sinusitis, unspecified: Secondary | ICD-10-CM

## 2019-08-06 MED ORDER — AMOXICILLIN-POT CLAVULANATE 875-125 MG PO TABS: 1.0000 | ORAL_TABLET | Freq: Two times a day (BID) | ORAL | 0 refills | Status: DC

## 2019-08-06 NOTE — Progress Notes (Signed)
We are sorry that you are not feeling well.  Here is how we plan to help!  Based on what you have shared with me it looks like you have sinusitis.  Sinusitis is inflammation and infection in the sinus cavities of the head.  Based on your presentation I believe you most likely have Acute Bacterial Sinusitis.  This is an infection caused by bacteria and is treated with antibiotics. I have prescribed Augmentin 875mg/125mg one tablet twice daily with food, for 7 days. You may use an oral decongestant such as Mucinex D or if you have glaucoma or high blood pressure use plain Mucinex. Saline nasal spray help and can safely be used as often as needed for congestion.  If you develop worsening sinus pain, fever or notice severe headache and vision changes, or if symptoms are not better after completion of antibiotic, please schedule an appointment with a health care provider.    Sinus infections are not as easily transmitted as other respiratory infection, however we still recommend that you avoid close contact with loved ones, especially the very young and elderly.  Remember to wash your hands thoroughly throughout the day as this is the number one way to prevent the spread of infection!  Home Care:  Only take medications as instructed by your medical team.  Complete the entire course of an antibiotic.  Do not take these medications with alcohol.  A steam or ultrasonic humidifier can help congestion.  You can place a towel over your head and breathe in the steam from hot water coming from a faucet.  Avoid close contacts especially the very young and the elderly.  Cover your mouth when you cough or sneeze.  Always remember to wash your hands.  Get Help Right Away If:  You develop worsening fever or sinus pain.  You develop a severe head ache or visual changes.  Your symptoms persist after you have completed your treatment plan.  Make sure you  Understand these instructions.  Will watch your  condition.  Will get help right away if you are not doing well or get worse.  Your e-visit answers were reviewed by a board certified advanced clinical practitioner to complete your personal care plan.  Depending on the condition, your plan could have included both over the counter or prescription medications.  If there is a problem please reply  once you have received a response from your provider.  Your safety is important to us.  If you have drug allergies check your prescription carefully.    You can use MyChart to ask questions about today's visit, request a non-urgent call back, or ask for a work or school excuse for 24 hours related to this e-Visit. If it has been greater than 24 hours you will need to follow up with your provider, or enter a new e-Visit to address those concerns.  You will get an e-mail in the next two days asking about your experience.  I hope that your e-visit has been valuable and will speed your recovery. Thank you for using e-visits.  Mosi Hannold PA-C  Approximately 5 minutes was spent documenting and reviewing patient's chart.    

## 2019-08-31 ENCOUNTER — Ambulatory Visit (INDEPENDENT_AMBULATORY_CARE_PROVIDER_SITE_OTHER): Payer: No Typology Code available for payment source | Admitting: Family Medicine

## 2019-08-31 ENCOUNTER — Encounter: Payer: Self-pay | Admitting: Family Medicine

## 2019-08-31 ENCOUNTER — Other Ambulatory Visit: Payer: Self-pay

## 2019-08-31 VITALS — BP 120/82 | HR 83 | Temp 98.0°F | Wt 260.0 lb

## 2019-08-31 DIAGNOSIS — Z125 Encounter for screening for malignant neoplasm of prostate: Secondary | ICD-10-CM | POA: Diagnosis not present

## 2019-08-31 DIAGNOSIS — Z0000 Encounter for general adult medical examination without abnormal findings: Secondary | ICD-10-CM

## 2019-08-31 LAB — CBC WITH DIFFERENTIAL/PLATELET
Basophils Absolute: 0.1 10*3/uL (ref 0.0–0.1)
Basophils Relative: 1.4 % (ref 0.0–3.0)
Eosinophils Absolute: 0.1 10*3/uL (ref 0.0–0.7)
Eosinophils Relative: 1.5 % (ref 0.0–5.0)
HCT: 44 % (ref 39.0–52.0)
Hemoglobin: 15.1 g/dL (ref 13.0–17.0)
Lymphocytes Relative: 21.4 % (ref 12.0–46.0)
Lymphs Abs: 1.5 10*3/uL (ref 0.7–4.0)
MCHC: 34.2 g/dL (ref 30.0–36.0)
MCV: 90 fl (ref 78.0–100.0)
Monocytes Absolute: 0.7 10*3/uL (ref 0.1–1.0)
Monocytes Relative: 10.5 % (ref 3.0–12.0)
Neutro Abs: 4.6 10*3/uL (ref 1.4–7.7)
Neutrophils Relative %: 65.2 % (ref 43.0–77.0)
Platelets: 238 10*3/uL (ref 150.0–400.0)
RBC: 4.89 Mil/uL (ref 4.22–5.81)
RDW: 13.8 % (ref 11.5–15.5)
WBC: 7.1 10*3/uL (ref 4.0–10.5)

## 2019-08-31 LAB — HEPATIC FUNCTION PANEL
ALT: 22 U/L (ref 0–53)
AST: 18 U/L (ref 0–37)
Albumin: 4.3 g/dL (ref 3.5–5.2)
Alkaline Phosphatase: 60 U/L (ref 39–117)
Bilirubin, Direct: 0.2 mg/dL (ref 0.0–0.3)
Total Bilirubin: 0.9 mg/dL (ref 0.2–1.2)
Total Protein: 6.9 g/dL (ref 6.0–8.3)

## 2019-08-31 LAB — LIPID PANEL
Cholesterol: 146 mg/dL (ref 0–200)
HDL: 44.4 mg/dL (ref >39.00)
LDL Cholesterol: 87 mg/dL (ref 0–99)
NonHDL: 102.03
Total CHOL/HDL Ratio: 3
Triglycerides: 74 mg/dL (ref 0.0–149.0)
VLDL: 14.8 mg/dL (ref 0.0–40.0)

## 2019-08-31 LAB — BASIC METABOLIC PANEL
BUN: 12 mg/dL (ref 6–23)
CO2: 28 mEq/L (ref 19–32)
Calcium: 9 mg/dL (ref 8.4–10.5)
Chloride: 103 mEq/L (ref 96–112)
Creatinine, Ser: 0.95 mg/dL (ref 0.40–1.50)
GFR: 83.89 mL/min (ref >60.00)
Glucose, Bld: 98 mg/dL (ref 70–99)
Potassium: 4 mEq/L (ref 3.5–5.1)
Sodium: 137 mEq/L (ref 135–145)

## 2019-08-31 LAB — PSA: PSA: 1.23 ng/mL (ref 0.10–4.00)

## 2019-08-31 LAB — TSH: TSH: 1.74 u[IU]/mL (ref 0.35–4.50)

## 2019-08-31 NOTE — Patient Instructions (Signed)
Health Maintenance Due  Topic Date Due  . HIV Screening  Never done  . COLONOSCOPY  Never done    No flowsheet data found.

## 2019-08-31 NOTE — Progress Notes (Signed)
Subjective:    Patient ID: Mitchell Castillo, male    DOB: 02-11-1971, 49 y.o.   MRN: [id number]  HPI Here for a well exam. He feels well. He has been working out at [employer] and has lost some weight. He recently saw Dr. Collene Mares and she is trying to set up a colonoscopy for him. The problem is scheduling, since this needs to be done at [facility]. Since he has severe sleep apnea, this cannot be done in her office.    Review of Systems  Constitutional: Negative.   HENT: Negative.   Eyes: Negative.   Respiratory: Negative.   Cardiovascular: Negative.   Gastrointestinal: Negative.   Genitourinary: Negative.   Musculoskeletal: Negative.   Skin: Negative.   Neurological: Negative.   Psychiatric/Behavioral: Negative.        Objective:   Physical Exam Constitutional:      General: He is not in acute distress.    Appearance: He is well-developed. He is obese. He is not diaphoretic.  HENT:     Head: Normocephalic and atraumatic.     Right Ear: External ear normal.     Left Ear: External ear normal.     Nose: Nose normal.     Mouth/Throat:     Pharynx: No oropharyngeal exudate.  Eyes:     General: No scleral icterus.       Right eye: No discharge.        Left eye: No discharge.     Conjunctiva/sclera: Conjunctivae normal.     Pupils: Pupils are equal, round, and reactive to light.  Neck:     Thyroid: No thyromegaly.     Vascular: No JVD.     Trachea: No tracheal deviation.  Cardiovascular:     Rate and Rhythm: Normal rate and regular rhythm.     Heart sounds: Normal heart sounds. No murmur. No friction rub. No gallop.   Pulmonary:     Effort: Pulmonary effort is normal. No respiratory distress.     Breath sounds: Normal breath sounds. No wheezing or rales.  Chest:     Chest wall: No tenderness.  Abdominal:     General: Bowel sounds are normal. There is no distension.     Palpations: Abdomen is soft. There is no mass.     Tenderness: There is no abdominal tenderness.  There is no guarding or rebound.  Genitourinary:    Penis: Normal. No tenderness.      Testes: Normal.     Rectum: Normal. Guaiac result negative.     Comments: Prostate mildly enlarged  Musculoskeletal:        General: No tenderness. Normal range of motion.     Cervical back: Neck supple.  Lymphadenopathy:     Cervical: No cervical adenopathy.  Skin:    General: Skin is warm and dry.     Coloration: Skin is not pale.     Findings: No erythema or rash.  Neurological:     Mental Status: He is alert and oriented to person, place, and time.     Cranial Nerves: No cranial nerve deficit.     Motor: No abnormal muscle tone.     Coordination: Coordination normal.     Deep Tendon Reflexes: Reflexes are normal and symmetric. Reflexes normal.  Psychiatric:        Behavior: Behavior normal.        Thought Content: Thought content normal.        Judgment: Judgment normal.  Assessment & Plan:  Well exam. We discussed diet and exercise. Get fasting labs. He will get the colonoscopy as above. I also recommended he get the Shingrix vaccine at his pharmacy.  Gershon Crane, MD

## 2019-11-23 ENCOUNTER — Other Ambulatory Visit: Payer: Self-pay

## 2019-11-24 ENCOUNTER — Ambulatory Visit (INDEPENDENT_AMBULATORY_CARE_PROVIDER_SITE_OTHER): Payer: No Typology Code available for payment source | Admitting: Family Medicine

## 2019-11-24 ENCOUNTER — Encounter: Payer: Self-pay | Admitting: Family Medicine

## 2019-11-24 VITALS — BP 124/70 | HR 79 | Temp 98.0°F | Wt 264.2 lb

## 2019-11-24 DIAGNOSIS — G8929 Other chronic pain: Secondary | ICD-10-CM

## 2019-11-24 DIAGNOSIS — M546 Pain in thoracic spine: Secondary | ICD-10-CM

## 2019-11-24 MED ORDER — METHYLPREDNISOLONE 4 MG PO TBPK: ORAL_TABLET | ORAL | 0 refills | Status: DC

## 2019-11-24 MED ORDER — CYCLOBENZAPRINE HCL 10 MG PO TABS: 10.0000 mg | ORAL_TABLET | Freq: Three times a day (TID) | ORAL | 2 refills | Status: DC | PRN

## 2019-11-24 MED FILL — METHYLPREDNISOLONE 4 MG TBP: 4 | 6 days supply | Qty: 21 | Fill #0

## 2019-11-24 MED FILL — CYCLOBENZAPRINE HCL 10 MG T: 10 | 20 days supply | Qty: 60 | Fill #0

## 2019-11-24 NOTE — Progress Notes (Signed)
   Subjective:    Patient ID: Mitchell Castillo, male    DOB: [birth year], 49 y.o.   MRN: [id number]  HPI Here for 3 months of intermittent sharp pains in the right middle back just below the scapula. No hx of trauma, but he has been working out with weights at the gym. Heat and Advil help somewhat. No bowel or urinary problems.    Review of Systems  Constitutional: Negative.   Respiratory: Negative.   Cardiovascular: Negative.   Gastrointestinal: Negative.   Genitourinary: Negative.   Musculoskeletal: Positive for back pain.       Objective:   Physical Exam Constitutional:      General: He is not in acute distress.    Appearance: Normal appearance.  Cardiovascular:     Rate and Rhythm: Normal rate and regular rhythm.     Pulses: Normal pulses.     Heart sounds: Normal heart sounds.  Pulmonary:     Effort: Pulmonary effort is normal.     Breath sounds: Normal breath sounds.  Musculoskeletal:     Comments: He is tender over the right middle back just below the scapula. ROM of the spine is full. No tenderness over the spine  Skin:    Findings: No rash.  Neurological:     Mental Status: He is alert.           Assessment & Plan:  Thoracic back pain. He will use Flexeril and a Medrol dose pack. Refer to PT. I advised him to avoid any upper body workouts until this is resolved.  Gershon Crane, MD

## 2019-12-13 ENCOUNTER — Encounter: Payer: Self-pay | Admitting: Family Medicine

## 2019-12-13 MED ORDER — VALACYCLOVIR HCL 1 G PO TABS: 1000.0000 mg | ORAL_TABLET | Freq: Three times a day (TID) | ORAL | 0 refills | Status: AC

## 2019-12-13 MED FILL — valACYclovir HCL 1 GM TABS: 1 | 10 days supply | Qty: 30 | Fill #0

## 2019-12-13 NOTE — Telephone Encounter (Signed)
I prefer Valacyclovir. Call in 1000 mg to take TID for 10 days

## 2019-12-13 NOTE — Telephone Encounter (Signed)
Please advise 

## 2020-01-13 ENCOUNTER — Telehealth: Payer: No Typology Code available for payment source | Admitting: Family

## 2020-01-13 DIAGNOSIS — J019 Acute sinusitis, unspecified: Secondary | ICD-10-CM

## 2020-01-13 DIAGNOSIS — B9689 Other specified bacterial agents as the cause of diseases classified elsewhere: Secondary | ICD-10-CM

## 2020-01-13 MED ORDER — AMOXICILLIN-POT CLAVULANATE 875-125 MG PO TABS: 1.0000 | ORAL_TABLET | Freq: Two times a day (BID) | ORAL | 0 refills | Status: DC

## 2020-01-13 MED FILL — AMOX-CLAV 875-125 MG TABLET: 875-125 | 7 days supply | Qty: 14 | Fill #0

## 2020-01-13 NOTE — Progress Notes (Signed)

## 2020-01-18 ENCOUNTER — Other Ambulatory Visit: Payer: Self-pay

## 2020-01-18 ENCOUNTER — Ambulatory Visit: Payer: No Typology Code available for payment source | Attending: Family Medicine | Admitting: Physical Therapy

## 2020-01-18 ENCOUNTER — Encounter: Payer: Self-pay | Admitting: Physical Therapy

## 2020-01-18 DIAGNOSIS — M62830 Muscle spasm of back: Secondary | ICD-10-CM | POA: Diagnosis present

## 2020-01-18 DIAGNOSIS — R293 Abnormal posture: Secondary | ICD-10-CM | POA: Diagnosis present

## 2020-01-18 DIAGNOSIS — M546 Pain in thoracic spine: Secondary | ICD-10-CM | POA: Insufficient documentation

## 2020-01-18 NOTE — Patient Instructions (Signed)

## 2020-01-19 NOTE — Therapy (Signed)
[facility] [address] [place], [place], [address] Phone: [phone]   Fax:  [phone]  Physical Therapy Evaluation  Patient Details  Name: Mitchell Castillo MRN: [id number] Date of Birth: [birth year] Referring Provider (PT): Gershon Crane, MD   Encounter Date: 01/18/2020   PT End of Session - 01/18/20 1139    Visit Number 1    Date for PT Re-Evaluation 03/01/20    Authorization Type [facility]    Authorization Time Period 01/18/20 to 03/01/20    PT Start Time 1100    PT Stop Time 1145    PT Time Calculation (min) 45 min    Activity Tolerance Patient tolerated treatment well;No increased pain    Behavior During Therapy WFL for tasks assessed/performed           Past Medical History:  Diagnosis Date  . Allergy   . OSA (obstructive sleep apnea) 05/29/2016   uses CPAP     Past Surgical History:  Procedure Laterality Date  . CHOLECYSTECTOMY    . VASECTOMY      There were no vitals filed for this visit.    Subjective Assessment - 01/18/20 1103    Subjective Pt states that he started having pain in the Rt thoracic spine region that would travel down and around the Rt side towards his shoudler blade. This seemed to start after going to the gym. He was going 3-4 days a week for an hour at a time. He would have pain after getting back from the gym. He has had shingles twice in the past year, so he was treated for this by his referring physician and was told not to lift anymore weights. He has been going almost 2 months without lifting weights and feels that he is 85% improved.    Patient Stated Goals improve pain to be able to get back into the gym    Currently in Pain? Yes    Pain Score 1     Pain Location Shoulder    Pain Orientation Right;Posterior    Pain Descriptors / Indicators Sore    Pain Type Chronic pain    Pain Radiating Towards none right now    Pain Onset More than a month ago    Pain Frequency  Intermittent    Aggravating Factors  pulling his arm across the chest, lifting out in front, transfers    Pain Relieving Factors pain medication helps, avoiding the gym helps    Effect of Pain on Daily Activities unable to get to the gym              [facility] PT Assessment - 01/19/20 0001      Assessment   Medical Diagnosis chronic Rt sided back pain     Referring Provider (PT) Gershon Crane, MD    Onset Date/Surgical Date --   February/March   Hand Dominance Right    Prior Therapy none for this       Precautions   Precautions None      Restrictions   Weight Bearing Restrictions No      Balance Screen   Has the patient fallen in the past 6 months No    Has the patient had a decrease in activity level because of a fear of falling?  No    Is the patient reluctant to leave their home because of a fear of falling?  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       Cognition   Overall Cognitive Status Within Functional Limits for tasks assessed      Sensation   Additional Comments denies numbness/tingling       AROM   Overall AROM Comments thoracic rotation: limited more on the Lt (+) pain Rt side less than 45 deg bilaterally; functional reach behind head and back pain free and WNL bilateral UE       Strength   Overall Strength Comments B UE strength 5/5 MMT       Palpation   Spinal mobility hypomobile T5 to T10, tenderness noted    Palpation comment trigger points Rt thoracic paraspinals, Rt rhoboid, Rt lower trap                      Objective measurements completed on examination: See above findings.       [facility] Adult PT Treatment/Exercise - 01/19/20 0001      Exercises   Exercises Other Exercises    Other Exercises  quadruped thoracic rotation/threading the needle x6 reps each direction, sidelying thoracic rotation x5 reps each; supine thoracic extension over foam roll 2x5 reps                    PT Education - 01/19/20 0903    Education Details implemented HEP thoracic rotation    Person(s) Educated Patient    Methods Explanation;Verbal cues;Handout    Comprehension Verbalized understanding;Returned demonstration            PT Short Term Goals - 01/19/20 0915      PT SHORT TERM GOAL #1   Title Pt will be independent with his initial HEP to improve flexibility and pain.    Time 3    Period Weeks    Status New    Target Date 02/09/20             PT Long Term Goals - 01/19/20 0916      PT LONG TERM GOAL #1   Title Pt will have greater than 45 deg of active thoracic rotation without increase in pain.    Time 6    Period Weeks    Status New    Target Date 02/29/20      PT LONG TERM GOAL #2   Title Pt will report atleast 90% improvement in his pain from its onset, to improve quality of life.    Time 6    Period Weeks    Status New      PT LONG TERM GOAL #3   Title Pt will be able to lift #20 box from the floor to a shelf at waist height x5 reps without increase in thoracic pain.    Time 6    Period Weeks    Status New      PT LONG TERM GOAL #4   Title Pt will be able to return to the gym atleast 2 days a week without increase in pain.    Time 6    Period Weeks    Status New                  Plan - 01/19/20 0905    Clinical Impression Statement Pt is a pleasant 49 y.o M referred to OPPT with complaints of Rt sided thoracic pain onset several months ago after returning to the gym. He had a history of shingles twice over the last year, so  his referring provider treated him for this and encouraged him to avoid the gym temporarily. In the last month or so, his pain is nearly 85% improved. He demonstrates limitations in active thoracic ROM, with painful stretch each direction. Pt has full UE strength, and his ROM is within normal limits. Pt has palpable tenderness and multiple trigger points in the Rt thoracic paraspinals, Rt rhomboids and other  periscapular musculature which is likely a source of his pain. 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    Personal Factors and Comorbidities Age;Fitness;Past/Current Experience;Profession    Examination-Activity Limitations Lift;Other    Examination-Participation Restrictions Other    Stability/Clinical Decision Making Stable/Uncomplicated    Clinical Decision Making Low    Rehab Potential Good    PT Frequency 1x / week    PT Duration 6 weeks    PT Treatment/Interventions Cryotherapy;Electrical Stimulation;Moist Heat;ADLs/Self Care Home Management;Therapeutic exercise;Patient/family education;Manual techniques;Dry needling;Spinal Manipulations;Joint Manipulations;Taping;Passive range of motion    PT Next Visit Plan thoracic mobilization, dry needling thx paraspinals, f/u on HEP, intro to scap strengthening    PT Home Exercise Plan [email or web address]    Consulted and Agree with Plan of Care Patient           Patient will benefit from skilled therapeutic intervention in order to improve the following deficits and impairments:  Decreased range of motion, Hypomobility, Improper body mechanics, Pain, Postural dysfunction, Increased muscle spasms  Visit Diagnosis: Pain in thoracic spine  Muscle spasm of back  Abnormal posture     Problem List Patient Active Problem List   Diagnosis Date Noted  . Lumbar strain, subsequent encounter 09/28/2018  . OSA (obstructive sleep apnea) 05/29/2016  . Eustachian tube dysfunction 07/13/2015  . Eczema 07/04/2013  . Shingles 07/04/2013  . SYNCOPE 02/12/2010  . PROSTATITIS, ACUTE 12/18/2008  . FOOT PAIN 12/18/2008    9:22 AM,01/19/20 Donita Brooks PT, DPT [facility]  [phone]  [facility] [address] [place], [place], [address] Phone: [phone]   Fax:  [phone]  Name: Mitchell Castillo MRN: [id number] Date of Birth: [birth year]

## 2020-01-24 ENCOUNTER — Encounter: Payer: Self-pay | Admitting: Physical Therapy

## 2020-01-24 ENCOUNTER — Other Ambulatory Visit: Payer: Self-pay

## 2020-01-24 ENCOUNTER — Ambulatory Visit: Payer: No Typology Code available for payment source | Admitting: Physical Therapy

## 2020-01-24 DIAGNOSIS — M62830 Muscle spasm of back: Secondary | ICD-10-CM

## 2020-01-24 DIAGNOSIS — R293 Abnormal posture: Secondary | ICD-10-CM

## 2020-01-24 DIAGNOSIS — M546 Pain in thoracic spine: Secondary | ICD-10-CM

## 2020-01-24 NOTE — Therapy (Signed)
[facility] [address] [place], [place], [address] Phone: [phone]   Fax:  [phone]  Physical Therapy Treatment  Patient Details  Name: Mitchell Castillo MRN: [id number] Date of Birth: Aug 13, 1970 Referring Provider (PT): Gershon Crane, MD   Encounter Date: 01/24/2020   PT End of Session - 01/24/20 1159    Visit Number 2    Date for PT Re-Evaluation 03/01/20    Authorization Type [facility]    Authorization Time Period 01/18/20 to 03/01/20    PT Start Time 1101    PT Stop Time 1145    PT Time Calculation (min) 44 min    Activity Tolerance Patient tolerated treatment well;No increased pain    Behavior During Therapy WFL for tasks assessed/performed           Past Medical History:  Diagnosis Date  . Allergy   . OSA (obstructive sleep apnea) 05/29/2016   uses CPAP     Past Surgical History:  Procedure Laterality Date  . CHOLECYSTECTOMY    . VASECTOMY      There were no vitals filed for this visit.   Subjective Assessment - 01/24/20 1123    Subjective Pt states that he was able to get back in the gym this past weekend and had no issues. He mostly avoided back exercises. Just minor soreness right now.    Patient Stated Goals improve pain to be able to get back into the gym    Currently in Pain? No/denies    Pain Onset More than a month ago              [facility] PT Assessment - 01/24/20 0001      Flexibility   Soft Tissue Assessment /Muscle Length --   Rt latissimus restricted                         [facility] Adult PT Treatment/Exercise - 01/24/20 0001      Exercises   Exercises Other Exercises;Lumbar      Lumbar Exercises: Stretches   Other Lumbar Stretch Exercise child's pose stretch 3x10 sec Lt and Rt     Other Lumbar Stretch Exercise Rt lat stretch in doorway 2x20 sec       Lumbar Exercises: Machines for Strengthening   Other Lumbar Machine Exercise lat pulldown #105 x8 reps (+)  Rt shoulder hike noted- improved with decrease in weight to #35 and PT cuing    Other Lumbar Machine Exercise power tower rows #40       Lumbar Exercises: Seated   Other Seated Lumbar Exercises thoracic extension over half foam roll 2x10 reps       Manual Therapy   Manual Therapy Joint mobilization    Manual therapy comments skilled palpation completed during dry needling     Joint Mobilization thoracic extension MWM 2x10 reps T8-T10; thoracic rotation with overpressure x10 reps Lt and Rt             Trigger Point Dry Needling - 01/24/20 0001    Consent Given? Yes    Education Handout Provided Previously provided    Muscles Treated Upper Quadrant Latissimus dorsi    Latissimus dorsi Response Twitch response elicited;Palpable increased muscle length   Rt                PT Education - 01/24/20 1159    Education Details technique with therex    Person(s) Educated Patient  Methods Explanation;Handout;Verbal cues    Comprehension Verbalized understanding;Returned demonstration            PT Short Term Goals - 01/19/20 0915      PT SHORT TERM GOAL #1   Title Pt will be independent with his initial HEP to improve flexibility and pain.    Time 3    Period Weeks    Status New    Target Date 02/09/20             PT Long Term Goals - 01/19/20 0916      PT LONG TERM GOAL #1   Title Pt will have greater than 45 deg of active thoracic rotation without increase in pain.    Time 6    Period Weeks    Status New    Target Date 02/29/20      PT LONG TERM GOAL #2   Title Pt will report atleast 90% improvement in his pain from its onset, to improve quality of life.    Time 6    Period Weeks    Status New      PT LONG TERM GOAL #3   Title Pt will be able to lift #20 box from the floor to a shelf at waist height x5 reps without increase in thoracic pain.    Time 6    Period Weeks    Status New      PT LONG TERM GOAL #4   Title Pt will be able to return to the gym  atleast 2 days a week without increase in pain.    Time 6    Period Weeks    Status New                 Plan - 01/24/20 1200    Clinical Impression Statement Pt was able to get back to the gym and complete the elliptical machine without any issue. Pt was able to replicate his technique with several of his gym exercises including rows and pulldowns. He has noted weakness in the Rt lat, as well as trigger points contributing to lack of flexibility and strength on that side. Pt was agreeable to dry needling with multiple twitch responses elicited. Pt was encouraged to ease back into his usual weightlifting routine. Will continue with current POC.    Personal Factors and Comorbidities Age;Fitness;Past/Current Experience;Profession    Examination-Activity Limitations Lift;Other    Examination-Participation Restrictions Other    Stability/Clinical Decision Making Stable/Uncomplicated    Rehab Potential Good    PT Frequency 1x / week    PT Duration 6 weeks    PT Treatment/Interventions Cryotherapy;Electrical Stimulation;Moist Heat;ADLs/Self Care Home Management;Therapeutic exercise;Patient/family education;Manual techniques;Dry needling;Spinal Manipulations;Joint Manipulations;Taping;Passive range of motion    PT Next Visit Plan thoracic mobilization, dry needling thx paraspinals, scap strengthening; f/u on lat DN and gym program    PT Home Exercise Plan [email or web address]    Consulted and Agree with Plan of Care Patient           Patient will benefit from skilled therapeutic intervention in order to improve the following deficits and impairments:  Decreased range of motion, Hypomobility, Improper body mechanics, Pain, Postural dysfunction, Increased muscle spasms  Visit Diagnosis: Muscle spasm of back  Abnormal posture  Pain in thoracic spine     Problem List Patient Active Problem List   Diagnosis Date Noted  . Lumbar strain, subsequent encounter 09/28/2018  . OSA (obstructive sleep  apnea) 05/29/2016  . Eustachian tube dysfunction 07/13/2015  .  Eczema 07/04/2013  . Shingles 07/04/2013  . SYNCOPE 02/12/2010  . PROSTATITIS, ACUTE 12/18/2008  . FOOT PAIN 12/18/2008    1:18 PM,01/24/20 Donita Brooks PT, DPT [facility]  [phone]  [facility] [address] [place], [place], [address] Phone: [phone]   Fax:  [phone]  Name: Mitchell Castillo MRN: [id number] Date of Birth: 1971-04-25

## 2020-01-24 NOTE — Patient Instructions (Signed)
Access Code: [email or web address] URL: [email or web address] Date: 01/24/2020 Prepared by: [facility]  Exercises .Sidelying Open Book Thoracic Rotation with Knee on Foam Roll - 1 x daily - 7 x weekly - 2 sets - 10 reps .Seated Thoracic Extension with Hands Behind Neck - 1 x daily - 7 x weekly - 2 sets - 10 reps .Latissimus Dorsi Stretch at Wall - 1 x daily - 7 x weekly - 3 reps - 20 seconds hold   [facility] [address] [place], [place] [address] Phone # [phone] Fax [phone]

## 2020-01-31 ENCOUNTER — Encounter: Payer: No Typology Code available for payment source | Admitting: Physical Therapy

## 2020-02-07 ENCOUNTER — Encounter: Payer: Self-pay | Admitting: Physical Therapy

## 2020-02-07 ENCOUNTER — Ambulatory Visit: Payer: No Typology Code available for payment source | Admitting: Physical Therapy

## 2020-02-07 ENCOUNTER — Other Ambulatory Visit: Payer: Self-pay

## 2020-02-07 DIAGNOSIS — R293 Abnormal posture: Secondary | ICD-10-CM

## 2020-02-07 DIAGNOSIS — M546 Pain in thoracic spine: Secondary | ICD-10-CM | POA: Diagnosis not present

## 2020-02-07 DIAGNOSIS — M62830 Muscle spasm of back: Secondary | ICD-10-CM

## 2020-02-07 NOTE — Therapy (Signed)
[facility] [address] [place], [place], [address] Phone: [phone]   Fax:  [phone]  Physical Therapy Treatment  Patient Details  Name: Mitchell Castillo MRN: [id number] Date of Birth: March 28, 1971 Referring Provider (PT): Gershon Crane, MD   Encounter Date: 02/07/2020   PT End of Session - 02/07/20 1444    Visit Number 3    Date for PT Re-Evaluation 03/01/20    Authorization Type [facility]    Authorization Time Period 01/18/20 to 03/01/20    PT Start Time 1102    PT Stop Time 1145    PT Time Calculation (min) 43 min    Activity Tolerance Patient tolerated treatment well;No increased pain    Behavior During Therapy WFL for tasks assessed/performed           Past Medical History:  Diagnosis Date  . Allergy   . OSA (obstructive sleep apnea) 05/29/2016   uses CPAP     Past Surgical History:  Procedure Laterality Date  . CHOLECYSTECTOMY    . VASECTOMY      There were no vitals filed for this visit.   Subjective Assessment - 02/07/20 1103    Subjective Pt states that he felt better until he visited his dad last week. He went to the gym on Sunday and his pain was worse that night when he went to roll out of bed. By the next morning it was gone.    Patient Stated Goals improve pain to be able to get back into the gym    Currently in Pain? Yes    Pain Score 2     Pain Location Scapula    Pain Orientation Right    Pain Descriptors / Indicators Dull;Sore    Pain Type Acute pain    Pain Onset More than a month ago                             [facility] Adult PT Treatment/Exercise - 02/07/20 0001      Exercises   Other Exercises  discussed exercise progressions and sets/reps at the gym: demonstration and tactile cuing for proper body mechanics with pulldowns and rows      Lumbar Exercises: Machines for Strengthening   Other Lumbar Machine Exercise lat pulldowns #65 2x10 reps (+) Rt shoulder  shrug improved with PT cuing     Other Lumbar Machine Exercise power tower rows: #65 2x10 reps       Manual Therapy   Manual therapy comments trigger point release Rt upper thoracic paraspinals, Rt rhomboids, Rt middle trap                   PT Education - 02/07/20 1712    Education Details safe mechanics/exercise progressions in reps/sets with gym equipment    Person(s) Educated Patient    Methods Explanation;Verbal cues    Comprehension Returned demonstration            PT Short Term Goals - 01/19/20 0915      PT SHORT TERM GOAL #1   Title Pt will be independent with his initial HEP to improve flexibility and pain.    Time 3    Period Weeks    Status New    Target Date 02/09/20             PT Long Term Goals - 01/19/20 0916      PT LONG TERM GOAL #  1   Title Pt will have greater than 45 deg of active thoracic rotation without increase in pain.    Time 6    Period Weeks    Status New    Target Date 02/29/20      PT LONG TERM GOAL #2   Title Pt will report atleast 90% improvement in his pain from its onset, to improve quality of life.    Time 6    Period Weeks    Status New      PT LONG TERM GOAL #3   Title Pt will be able to lift #20 box from the floor to a shelf at waist height x5 reps without increase in thoracic pain.    Time 6    Period Weeks    Status New      PT LONG TERM GOAL #4   Title Pt will be able to return to the gym atleast 2 days a week without increase in pain.    Time 6    Period Weeks    Status New                 Plan - 02/07/20 1443    Clinical Impression Statement Pt was making steady progress with improved Rt scapulothoracic pain after his last appointments. He noted increase in pain over the weekend when attempting to press up in his bed with the Rt UE. Pt had palpable trigger points in the Rt mid thoracic parapinals, Rt rhomboids and middle/lower trap region. PT completed trigger point release and spent a large  portion of the session reviewing safe exercise technique and progressions for the gym. Pt felt that his pain was improved end of today's session.    Personal Factors and Comorbidities Age;Fitness;Past/Current Experience;Profession    Examination-Activity Limitations Lift;Other    Examination-Participation Restrictions Other    Stability/Clinical Decision Making Stable/Uncomplicated    Rehab Potential Good    PT Frequency 1x / week    PT Duration 6 weeks    PT Treatment/Interventions Cryotherapy;Electrical Stimulation;Moist Heat;ADLs/Self Care Home Management;Therapeutic exercise;Patient/family education;Manual techniques;Dry needling;Spinal Manipulations;Joint Manipulations;Taping;Passive range of motion    PT Next Visit Plan f/u on HEP adjustment; dry needling thx paraspinals, scap strengthening; f/u on lat DN and gym program    PT Home Exercise Plan [id number]    Consulted and Agree with Plan of Care Patient           Patient will benefit from skilled therapeutic intervention in order to improve the following deficits and impairments:  Decreased range of motion, Hypomobility, Improper body mechanics, Pain, Postural dysfunction, Increased muscle spasms  Visit Diagnosis: Muscle spasm of back  Abnormal posture  Pain in thoracic spine     Problem List Patient Active Problem List   Diagnosis Date Noted  . Lumbar strain, subsequent encounter 09/28/2018  . OSA (obstructive sleep apnea) 05/29/2016  . Eustachian tube dysfunction 07/13/2015  . Eczema 07/04/2013  . Shingles 07/04/2013  . SYNCOPE 02/12/2010  . PROSTATITIS, ACUTE 12/18/2008  . FOOT PAIN 12/18/2008    5:13 PM,02/07/20 Donita Brooks PT, DPT [facility]  [phone]  [facility] [address] [place], [place], [address] Phone: [phone]   Fax:  [phone]  Name: Mitchell Castillo MRN: [id number] Date of Birth:  [birth year]

## 2020-02-23 ENCOUNTER — Other Ambulatory Visit: Payer: Self-pay

## 2020-02-23 ENCOUNTER — Ambulatory Visit: Payer: No Typology Code available for payment source | Attending: Family Medicine | Admitting: Physical Therapy

## 2020-02-23 DIAGNOSIS — M546 Pain in thoracic spine: Secondary | ICD-10-CM | POA: Diagnosis present

## 2020-02-23 DIAGNOSIS — M62830 Muscle spasm of back: Secondary | ICD-10-CM | POA: Diagnosis present

## 2020-02-23 DIAGNOSIS — R293 Abnormal posture: Secondary | ICD-10-CM | POA: Diagnosis present

## 2020-02-23 NOTE — Therapy (Addendum)
[facility] [address], [place], [place], [address] Phone: [phone]   Fax:  [phone]  Physical Therapy Treatment/Discharge Summary   Patient Details  Name: Mitchell Castillo MRN: [id number] Date of Birth: 1970-08-16 Referring Provider (PT): Alysia Penna, MD   Encounter Date: 02/23/2020   PT End of Session - 02/23/20 1536    Visit Number 4    Date for PT Re-Evaluation 03/01/20    Authorization Type [facility]    Authorization Time Period 01/18/20 to 03/01/20    PT Start Time 1102    PT Stop Time 1059    PT Time Calculation (min) 44 min    Activity Tolerance Patient tolerated treatment well;No increased pain    Behavior During Therapy WFL for tasks assessed/performed           Past Medical History:  Diagnosis Date  . Allergy   . OSA (obstructive sleep apnea) 05/29/2016   uses CPAP     Past Surgical History:  Procedure Laterality Date  . CHOLECYSTECTOMY    . VASECTOMY      There were no vitals filed for this visit.   Subjective Assessment - 02/23/20 1528    Subjective Pt states that he felt great last week but yesterday was terrible. His pain would come on and make him stop what he was doing. He has made adjustments to his position at night, and this helped alot.    Patient Stated Goals improve pain to be able to get back into the gym    Currently in Pain? Yes   no ratinge given: "it's starting to build"   Pain Onset More than a month ago                             [facility] Adult PT Treatment/Exercise - 02/23/20 0001      Exercises   Other Exercises  exercise participation at the gym; impact daily activity and posture has on his pain at the end of the day       Manual Therapy   Manual Therapy Soft tissue mobilization    Joint Mobilization Rt rib mobilization inferior x2 bouts grade III at level of T3-T6; grade III CPAs T3-T10 x2 bouts     Soft tissue mobilization Rt thoracic  parapsinals, Rt rhomboids, Rt levator scap             Trigger Point Dry Needling - 02/23/20 0001    Consent Given? Yes    Education Handout Provided Previously provided    Muscles Treated Back/Hip Thoracic multifidi    Thoracic multifidi response Twitch response elicited;Palpable increased muscle length   Rt T4 to T8               PT Education - 02/23/20 1534    Education Details participation at the gym following modifications made; placing pt on hold to allow time to address personal/family issues    Person(s) Educated Patient    Methods Explanation    Comprehension Verbalized understanding            PT Short Term Goals - 01/19/20 0915      PT SHORT TERM GOAL #1   Title Pt will be independent with his initial HEP to improve flexibility and pain.    Time 3    Period Weeks    Status New    Target Date 02/09/20  PT Long Term Goals - 02/23/20 1559      PT LONG TERM GOAL #1   Title Pt will have greater than 45 deg of active thoracic rotation without increase in pain.    Time 6    Period Weeks    Status Partially Met      PT LONG TERM GOAL #2   Title Pt will report atleast 90% improvement in his pain from its onset, to improve quality of life.    Baseline pain is variable: improved at night    Time 6    Period Weeks    Status On-going      PT LONG TERM GOAL #3   Title Pt will be able to lift #20 box from the floor to a shelf at waist height x5 reps without increase in thoracic pain.    Time 6    Period Weeks    Status New      PT LONG TERM GOAL #4   Title Pt will be able to return to the gym atleast 2 days a week without increase in pain.    Time 6    Period Weeks    Status Achieved                 Plan - 02/23/20 1537    Clinical Impression Statement Pt has had variable improvements in his pain since beginning PT. He has had 3 treatment sessions and was provided HEP that he is reportedly completing throughout the week. Pt has  palpable tenderness of the Rt thoracic paraspinals and other periscapular muscles. He was agreeable to dry needling today. Several twitch responses were elicited. Pt reported improved pain following this. 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 Pt would benefit from continuation of skilled PT, but he would like to put PT on hold temporarily until he is able to handle a personal/family conflict.    Personal Factors and Comorbidities Age;Fitness;Past/Current Experience;Profession    Examination-Activity Limitations Lift;Other    Examination-Participation Restrictions Other    Stability/Clinical Decision Making Stable/Uncomplicated    Rehab Potential Good    PT Frequency 1x / week    PT Duration 6 weeks    PT Treatment/Interventions Cryotherapy;Electrical Stimulation;Moist Heat;ADLs/Self Care Home Management;Therapeutic exercise;Patient/family education;Manual techniques;Dry needling;Spinal Manipulations;Joint Manipulations;Taping;Passive range of motion    PT Next Visit Plan dry needling thx paraspinals/multifidi, scap strengthening    PT Home Exercise Plan [id number]    Consulted and Agree with Plan of Care Patient           Patient will benefit from skilled therapeutic intervention in order to improve the following deficits and impairments:  Decreased range of motion, Hypomobility, Improper body mechanics, Pain, Postural dysfunction, Increased muscle spasms  Visit Diagnosis: Muscle spasm of back  Abnormal posture  Pain in thoracic spine  PHYSICAL THERAPY DISCHARGE SUMMARY  Visits from Start of Care: 4  Current functional level related to goals / functional outcomes: The patient has not returned since last visit on 9/16.     Remaining deficits: As above   Education / Equipment: HEP Plan:                                                    Patient  goals were partially met. Patient  is being discharged due to not returning since the last visit.  ?????       Problem List Patient Active Problem List   Diagnosis Date Noted  . Lumbar strain, subsequent encounter 09/28/2018  . OSA (obstructive sleep apnea) 05/29/2016  . Eustachian tube dysfunction 07/13/2015  . Eczema 07/04/2013  . Shingles 07/04/2013  . SYNCOPE 02/12/2010  . PROSTATITIS, ACUTE 12/18/2008  . FOOT PAIN 12/18/2008   Ruben Im, PT 05/15/20 8:14 AM Phone: [phone] Fax: [phone] 3:59 PM,02/23/20 Sherol Dade PT, DPT [facility] [address], [place], [place], [address] Phone: [phone]   Fax:  [phone]  Name: TRAMAINE SAULS MRN: [id number] Date of Birth: Dec 15, 1970

## 2020-07-24 ENCOUNTER — Encounter: Payer: Self-pay | Admitting: Family Medicine

## 2020-07-24 DIAGNOSIS — Z0000 Encounter for general adult medical examination without abnormal findings: Secondary | ICD-10-CM

## 2020-07-24 DIAGNOSIS — L989 Disorder of the skin and subcutaneous tissue, unspecified: Secondary | ICD-10-CM

## 2020-07-25 NOTE — Telephone Encounter (Signed)
Done

## 2020-08-31 ENCOUNTER — Encounter: Payer: No Typology Code available for payment source | Admitting: Family Medicine

## 2020-08-31 ENCOUNTER — Other Ambulatory Visit: Payer: Self-pay

## 2020-09-03 ENCOUNTER — Other Ambulatory Visit: Payer: Self-pay

## 2020-09-03 ENCOUNTER — Other Ambulatory Visit (HOSPITAL_COMMUNITY): Payer: Self-pay | Admitting: Family Medicine

## 2020-09-03 ENCOUNTER — Encounter: Payer: Self-pay | Admitting: Family Medicine

## 2020-09-03 ENCOUNTER — Ambulatory Visit (INDEPENDENT_AMBULATORY_CARE_PROVIDER_SITE_OTHER): Payer: No Typology Code available for payment source | Admitting: Family Medicine

## 2020-09-03 ENCOUNTER — Ambulatory Visit (INDEPENDENT_AMBULATORY_CARE_PROVIDER_SITE_OTHER): Payer: No Typology Code available for payment source

## 2020-09-03 VITALS — BP 140/90 | HR 68 | Temp 97.8°F | Ht 68.5 in | Wt 274.0 lb

## 2020-09-03 DIAGNOSIS — G8929 Other chronic pain: Secondary | ICD-10-CM | POA: Diagnosis not present

## 2020-09-03 DIAGNOSIS — M546 Pain in thoracic spine: Secondary | ICD-10-CM

## 2020-09-03 DIAGNOSIS — Z0000 Encounter for general adult medical examination without abnormal findings: Secondary | ICD-10-CM

## 2020-09-03 LAB — BASIC METABOLIC PANEL
BUN: 15 mg/dL (ref 6–23)
CO2: 27 mEq/L (ref 19–32)
Calcium: 9.3 mg/dL (ref 8.4–10.5)
Chloride: 102 mEq/L (ref 96–112)
Creatinine, Ser: 0.98 mg/dL (ref 0.40–1.50)
GFR: 89.54 mL/min (ref >60.00)
Glucose, Bld: 100 mg/dL — ABNORMAL HIGH (ref 70–99)
Potassium: 4.2 mEq/L (ref 3.5–5.1)
Sodium: 136 mEq/L (ref 135–145)

## 2020-09-03 LAB — LIPID PANEL
Cholesterol: 181 mg/dL (ref 0–200)
HDL: 44.6 mg/dL (ref >39.00)
LDL Cholesterol: 113 mg/dL — ABNORMAL HIGH (ref 0–99)
NonHDL: 136.61
Total CHOL/HDL Ratio: 4
Triglycerides: 120 mg/dL (ref 0.0–149.0)
VLDL: 24 mg/dL (ref 0.0–40.0)

## 2020-09-03 LAB — PSA: PSA: 1.23 ng/mL (ref 0.10–4.00)

## 2020-09-03 LAB — CBC WITH DIFFERENTIAL/PLATELET
Basophils Absolute: 0.1 10*3/uL (ref 0.0–0.1)
Basophils Relative: 1.7 % (ref 0.0–3.0)
Eosinophils Absolute: 0.2 10*3/uL (ref 0.0–0.7)
Eosinophils Relative: 2.2 % (ref 0.0–5.0)
HCT: 49 % (ref 39.0–52.0)
Hemoglobin: 16.7 g/dL (ref 13.0–17.0)
Lymphocytes Relative: 20.4 % (ref 12.0–46.0)
Lymphs Abs: 1.6 10*3/uL (ref 0.7–4.0)
MCHC: 34 g/dL (ref 30.0–36.0)
MCV: 91.3 fl (ref 78.0–100.0)
Monocytes Absolute: 1 10*3/uL (ref 0.1–1.0)
Monocytes Relative: 12.6 % — ABNORMAL HIGH (ref 3.0–12.0)
Neutro Abs: 4.9 10*3/uL (ref 1.4–7.7)
Neutrophils Relative %: 63.1 % (ref 43.0–77.0)
Platelets: 231 10*3/uL (ref 150.0–400.0)
RBC: 5.37 Mil/uL (ref 4.22–5.81)
RDW: 13.7 % (ref 11.5–15.5)
WBC: 7.7 10*3/uL (ref 4.0–10.5)

## 2020-09-03 LAB — HEPATIC FUNCTION PANEL
ALT: 43 U/L (ref 0–53)
AST: 24 U/L (ref 0–37)
Albumin: 4.7 g/dL (ref 3.5–5.2)
Alkaline Phosphatase: 67 U/L (ref 39–117)
Bilirubin, Direct: 0.1 mg/dL (ref 0.0–0.3)
Total Bilirubin: 0.8 mg/dL (ref 0.2–1.2)
Total Protein: 7.3 g/dL (ref 6.0–8.3)

## 2020-09-03 LAB — URINALYSIS
Bilirubin Urine: NEGATIVE
Hgb urine dipstick: NEGATIVE
Ketones, ur: NEGATIVE
Leukocytes,Ua: NEGATIVE
Nitrite: NEGATIVE
Specific Gravity, Urine: 1.02 (ref 1.000–1.030)
Total Protein, Urine: NEGATIVE
Urine Glucose: NEGATIVE
Urobilinogen, UA: 0.2 (ref 0.0–1.0)
pH: 6 (ref 5.0–8.0)

## 2020-09-03 LAB — TSH: TSH: 1.59 u[IU]/mL (ref 0.35–4.50)

## 2020-09-03 MED ORDER — PHENTERMINE HCL 37.5 MG PO CAPS: 37.5000 mg | ORAL_CAPSULE | ORAL | 1 refills | Status: DC

## 2020-09-03 MED FILL — PHENTERMINE 37.5 MG CAPSULE: 37.5 | 90 days supply | Qty: 90 | Fill #0

## 2020-09-03 NOTE — Progress Notes (Signed)
Subjective:    Patient ID: Mitchell Castillo, male    DOB: Nov 22, 1970, 50 y.o.   MRN: [id number]  HPI Here for a well exam. He wants to discuss several issues. First he still has the sharp right sided mid back pain that he had one year ago. He went to PT for 4 sessions and this included dry needling, but nothing has helped. He still works out at [employer] 3 days a week, but he always has the pain. No cough or SOB. Also he still struggles with his weight. He eats a healthy diet and works out, but he still slowly gains weight.    Review of Systems  Constitutional: Negative.   HENT: Negative.   Eyes: Negative.   Respiratory: Negative.   Cardiovascular: Negative.   Gastrointestinal: Negative.   Genitourinary: Negative.   Musculoskeletal: Positive for back pain.  Skin: Negative.   Neurological: Negative.   Psychiatric/Behavioral: Negative.        Objective:   Physical Exam Constitutional:      General: He is not in acute distress.    Appearance: He is well-developed. He is obese. He is not diaphoretic.  HENT:     Head: Normocephalic and atraumatic.     Right Ear: External ear normal.     Left Ear: External ear normal.     Nose: Nose normal.     Mouth/Throat:     Pharynx: No oropharyngeal exudate.  Eyes:     General: No scleral icterus.       Right eye: No discharge.        Left eye: No discharge.     Conjunctiva/sclera: Conjunctivae normal.     Pupils: Pupils are equal, round, and reactive to light.  Neck:     Thyroid: No thyromegaly.     Vascular: No JVD.     Trachea: No tracheal deviation.  Cardiovascular:     Rate and Rhythm: Normal rate and regular rhythm.     Heart sounds: Normal heart sounds. No murmur heard. No friction rub. No gallop.   Pulmonary:     Effort: Pulmonary effort is normal. No respiratory distress.     Breath sounds: Normal breath sounds. No wheezing or rales.  Chest:     Chest wall: No tenderness.  Abdominal:     General: Bowel sounds are normal.  There is no distension.     Palpations: Abdomen is soft. There is no mass.     Tenderness: There is no abdominal tenderness. There is no guarding or rebound.  Genitourinary:    Penis: Normal. No tenderness.      Testes: Normal.     Prostate: Normal.     Rectum: Normal. Guaiac result negative.  Musculoskeletal:        General: Normal range of motion.     Cervical back: Neck supple.     Comments: Mildly tender along the right side of the thoracic spine, full ROM   Lymphadenopathy:     Cervical: No cervical adenopathy.  Skin:    General: Skin is warm and dry.     Coloration: Skin is not pale.     Findings: No erythema or rash.  Neurological:     Mental Status: He is alert and oriented to person, place, and time.     Cranial Nerves: No cranial nerve deficit.     Motor: No abnormal muscle tone.     Coordination: Coordination normal.     Deep Tendon Reflexes: Reflexes are normal  and symmetric. Reflexes normal.  Psychiatric:        Behavior: Behavior normal.        Thought Content: Thought content normal.        Judgment: Judgment normal.           Assessment & Plan:  Well exam. We discussed diet and exercise. He will try Phentermine 37.5 mg daily for 3-6 months. His BP is borderline high, and I think his weight is the main contributor to this. He is scheduled for a colonoscopy in a few weeks.  Gershon Crane, MD

## 2020-09-11 ENCOUNTER — Encounter: Payer: Self-pay | Admitting: Family Medicine

## 2020-09-12 ENCOUNTER — Other Ambulatory Visit (HOSPITAL_COMMUNITY): Payer: Self-pay

## 2020-09-12 MED ORDER — SCOPOLAMINE 1 MG/3DAYS TD PT72
1.0000 | MEDICATED_PATCH | TRANSDERMAL | 12 refills | Status: DC
  Filled 2020-09-12: qty 10, 30d supply, fill #0
  Filled 2020-11-06 – 2020-11-13 (×2): qty 10, 30d supply, fill #1

## 2020-09-12 NOTE — Telephone Encounter (Signed)
Done

## 2020-09-13 ENCOUNTER — Other Ambulatory Visit (HOSPITAL_COMMUNITY): Payer: Self-pay

## 2020-09-27 ENCOUNTER — Other Ambulatory Visit: Payer: Self-pay | Admitting: Gastroenterology

## 2020-10-11 ENCOUNTER — Other Ambulatory Visit (HOSPITAL_COMMUNITY): Payer: Self-pay

## 2020-10-11 MED ORDER — ZOSTER VAC RECOMB ADJUVANTED 50 MCG/0.5ML IM SUSR
0.5000 mL | INTRAMUSCULAR | 0 refills | Status: DC
  Filled 2020-10-11: qty 0.5, 1d supply, fill #0

## 2020-10-24 ENCOUNTER — Other Ambulatory Visit (HOSPITAL_COMMUNITY)
Admission: RE | Admit: 2020-10-24 | Discharge: 2020-10-24 | Disposition: A | Discharge status: Home / Self Care | Payer: No Typology Code available for payment source | Source: Ambulatory Visit | Attending: Gastroenterology | Admitting: Gastroenterology

## 2020-10-24 DIAGNOSIS — Z20822 Contact with and (suspected) exposure to covid-19: Secondary | ICD-10-CM | POA: Diagnosis not present

## 2020-10-24 DIAGNOSIS — Z01812 Encounter for preprocedural laboratory examination: Secondary | ICD-10-CM | POA: Diagnosis not present

## 2020-10-24 LAB — SARS CORONAVIRUS 2 (TAT 6-24 HRS): SARS Coronavirus 2: NEGATIVE

## 2020-10-25 NOTE — Anesthesia Preprocedure Evaluation (Addendum)
Anesthesia Evaluation  Patient identified by MRN, date of birth, ID band Patient awake    Reviewed: Allergy & Precautions, NPO status , Patient's Chart, lab work & pertinent test results  Airway Mallampati: II  TM Distance: >3 FB Neck ROM: Full    Dental no notable dental hx. (+) Implants, Dental Advisory Given   Pulmonary sleep apnea ,    Pulmonary exam normal breath sounds clear to auscultation       Cardiovascular hypertension, Normal cardiovascular exam Rhythm:Regular Rate:Normal     Neuro/Psych    GI/Hepatic Neg liver ROS,   Endo/Other  negative endocrine ROS  Renal/GU negative Renal ROS     Musculoskeletal negative musculoskeletal ROS (+)   Abdominal (+) + obese,   Peds  Hematology   Anesthesia Other Findings   Reproductive/Obstetrics negative OB ROS                            Anesthesia Physical Anesthesia Plan  ASA: III  Anesthesia Plan: MAC   Post-op Pain Management:    Induction:   PONV Risk Score and Plan: Treatment may vary due to age or medical condition  Airway Management Planned: Natural Airway  Additional Equipment: None  Intra-op Plan:   Post-operative Plan:   Informed Consent: I have reviewed the patients History and Physical, chart, labs and discussed the procedure including the risks, benefits and alternatives for the proposed anesthesia with the patient or authorized representative who has indicated his/her understanding and acceptance.     Dental advisory given  Plan Discussed with:   Anesthesia Plan Comments: (Screening Colonoscopy)       Anesthesia Quick Evaluation

## 2020-10-26 ENCOUNTER — Ambulatory Visit (HOSPITAL_COMMUNITY): Payer: No Typology Code available for payment source | Admitting: Anesthesiology

## 2020-10-26 ENCOUNTER — Encounter (HOSPITAL_COMMUNITY): Payer: Self-pay | Admitting: Gastroenterology

## 2020-10-26 ENCOUNTER — Ambulatory Visit (HOSPITAL_COMMUNITY)
Admission: RE | Admit: 2020-10-26 | Discharge: 2020-10-26 | Disposition: A | Discharge status: Home / Self Care | Payer: No Typology Code available for payment source | Attending: Gastroenterology | Admitting: Gastroenterology

## 2020-10-26 ENCOUNTER — Other Ambulatory Visit: Payer: Self-pay

## 2020-10-26 ENCOUNTER — Encounter (HOSPITAL_COMMUNITY)
Admission: RE | Disposition: A | Discharge status: Home / Self Care | Payer: Self-pay | Source: Home / Self Care | Attending: Gastroenterology

## 2020-10-26 DIAGNOSIS — Z1211 Encounter for screening for malignant neoplasm of colon: Secondary | ICD-10-CM | POA: Diagnosis present

## 2020-10-26 DIAGNOSIS — K621 Rectal polyp: Secondary | ICD-10-CM | POA: Diagnosis not present

## 2020-10-26 DIAGNOSIS — Z800 Family history of malignant neoplasm of digestive organs: Secondary | ICD-10-CM | POA: Insufficient documentation

## 2020-10-26 DIAGNOSIS — K5730 Diverticulosis of large intestine without perforation or abscess without bleeding: Secondary | ICD-10-CM | POA: Insufficient documentation

## 2020-10-26 HISTORY — PX: POLYPECTOMY: SHX5525

## 2020-10-26 HISTORY — PX: COLONOSCOPY WITH PROPOFOL: SHX5780

## 2020-10-26 SURGERY — COLONOSCOPY WITH PROPOFOL
Anesthesia: Monitor Anesthesia Care

## 2020-10-26 MED ORDER — LACTATED RINGERS IV SOLN: INTRAVENOUS | Status: DC | PRN

## 2020-10-26 MED ORDER — SODIUM CHLORIDE 0.9 % IV SOLN: INTRAVENOUS | Status: DC

## 2020-10-26 MED ORDER — PROPOFOL 500 MG/50ML IV EMUL
INTRAVENOUS | Status: DC | PRN
  Administered 2020-10-26: 125 ug/kg/min via INTRAVENOUS
  Administered 2020-10-26: 100 ug/kg/min via INTRAVENOUS

## 2020-10-26 MED ORDER — LACTATED RINGERS IV SOLN: Freq: Once | INTRAVENOUS | Status: AC

## 2020-10-26 MED ORDER — PROPOFOL 1000 MG/100ML IV EMUL
INTRAVENOUS | Status: AC
  Filled 2020-10-26: qty 100

## 2020-10-26 MED ORDER — LIDOCAINE HCL 1 % IJ SOLN
INTRAMUSCULAR | Status: DC | PRN
  Administered 2020-10-26: 50 mg via INTRADERMAL

## 2020-10-26 SURGICAL SUPPLY — 21 items

## 2020-10-26 NOTE — Op Note (Signed)
[facility] Patient Name: Mitchell Castillo Procedure Date: 10/26/2020 MRN: [id number] Attending MD: Jeani HawkingPatrick Laresha Bacorn , MD Date of Birth: 1971-06-07 CSN: [id number] Age: [age] Admit Type: Outpatient Procedure:                Colonoscopy Indications:              Screening for colorectal malignant neoplasm Providers:                Jeani HawkingPatrick Laquesha Holcomb, MD, Clearnce Sorrelarrie Baker, RN, Lawson Radararlene Davis,                            Technician, Marlena ClipperPeggy Flynn-Cook, CRNA Referring MD:              Medicines:                Propofol per Anesthesia Complications:            No immediate complications. Estimated Blood Loss:     Estimated blood loss: none. Procedure:                Pre-Anesthesia Assessment:                           - Prior to the procedure, a History and Physical                            was performed, and patient medications and                            allergies were reviewed. The patient's tolerance of                            previous anesthesia was also reviewed. The risks                            and benefits of the procedure and the sedation                            options and risks were discussed with the patient.                            All questions were answered, and informed consent                            was obtained. Prior Anticoagulants: The patient has                            taken no previous anticoagulant or antiplatelet                            agents. ASA Grade Assessment: III - A patient with                            severe systemic disease. After reviewing the risks  and benefits, the patient was deemed in                            satisfactory condition to undergo the procedure.                           - Sedation was administered by an anesthesia                            professional. Deep sedation was attained.                           After obtaining informed consent, the colonoscope                            was  passed under direct vision. Throughout the                            procedure, the patient's blood pressure, pulse, and                            oxygen saturations were monitored continuously. The                            CF-HQ190L ([id number] Olympus colonoscope was                            introduced through the anus and advanced to the the                            cecum, identified by appendiceal orifice and                            ileocecal valve. The colonoscopy was performed                            without difficulty. The patient tolerated the                            procedure well. The quality of the bowel                            preparation was good. The ileocecal valve,                            appendiceal orifice, and rectum were photographed. Scope In: 9:39:46 AM Scope Out: 9:56:24 AM Scope Withdrawal Time: 0 hours 13 minutes 17 seconds  Total Procedure Duration: 0 hours 16 minutes 38 seconds  Findings:      A 5 mm polyp was found in the rectum and it may represent a mucosal       prolapse polyp. The polyp was pedunculated and it was removed with a hot       snare. Resection and retrieval were complete. The image of the polyp did       not capture  properly.      A few small-mouthed diverticula were found in the sigmoid colon. Impression:               - One 5 mm polyp in the rectum, removed with a hot                            snare. Resected and retrieved.                           - Diverticulosis in the sigmoid colon. Moderate Sedation:      Not Applicable - Patient had care per Anesthesia. Recommendation:           - Patient has a contact number available for                            emergencies. The signs and symptoms of potential                            delayed complications were discussed with the                            patient. Return to normal activities tomorrow.                            Written discharge instructions were provided  to the                            patient.                           - Resume previous diet.                           - Continue present medications.                           - Await pathology results.                           - Repeat colonoscopy in 7-10 years for surveillance. Procedure Code(s):        --- Professional ---                           [phone], Colonoscopy, flexible; with removal of                            tumor(s), polyp(s), or other lesion(s) by snare                            technique Diagnosis Code(s):        --- Professional ---                           Z12.11, Encounter for screening for malignant  neoplasm of colon                           K62.1, Rectal polyp                           K57.30, Diverticulosis of large intestine without                            perforation or abscess without bleeding CPT copyright 2019 American Medical Association. All rights reserved. The codes documented in this report are preliminary and upon coder review may  be revised to meet current compliance requirements. Jeani Hawking, MD Jeani Hawking, MD 10/26/2020 10:04:07 AM This report has been signed electronically. Number of Addenda: 0

## 2020-10-26 NOTE — Transfer of Care (Signed)
Immediate Anesthesia Transfer of Care Note  Patient: Mitchell Castillo  Procedure(s) Performed: COLONOSCOPY WITH PROPOFOL (N/A ) POLYPECTOMY  Patient Location: PACU and Endoscopy Unit  Anesthesia Type:MAC  Level of Consciousness: awake, alert , oriented and patient cooperative  Airway & Oxygen Therapy: Patient Spontanous Breathing and Patient connected to face mask oxygen  Post-op Assessment: Post -op Vital signs reviewed and stable  Post vital signs: Reviewed and stable  Last Vitals:  Vitals Value Taken Time  BP    Temp    Pulse 80 10/26/20 1003  Resp 18 10/26/20 1003  SpO2 100 % 10/26/20 1003  Vitals shown include unvalidated device data.  Last Pain:  Vitals:   10/26/20 0825  TempSrc: Oral  PainSc: 0-No pain         Complications: No complications documented.

## 2020-10-26 NOTE — H&P (Signed)
  Mitchell Castillo   HPI: This 50 year old white male presents to the office for colorectal cancer screening. He has 1-2 BM's per day with no obvious blood or mucus in the stool. He has good appetite. He started Phentermine 2 weeks ago. He reports to have lost 13 pounds over the past 2 weeks. He denies having any complaints of abdominal pain, nausea, vomiting, acid reflux, dysphagia or odynophagia. He denies having a family history of colon cancer, celiac sprue or IBD. His maternal grandmother died of metastatic colon cancer in her mid-60's.    Past Medical History:  Diagnosis Date  . Allergy   . OSA (obstructive sleep apnea) 05/29/2016   uses CPAP     Past Surgical History:  Procedure Laterality Date  . CHOLECYSTECTOMY    . VASECTOMY      Family History  Problem Relation Age of Onset  . Diabetes Mother   . Breast cancer Mother   . Heart attack Paternal Uncle   . Colon cancer Maternal Grandmother     Social History:  reports that he has never smoked. He has never used smokeless tobacco. He reports current alcohol use of about 1.0 standard drink of alcohol per week. He reports that he does not use drugs.  Allergies: No Known Allergies  Medications:  Scheduled:  Continuous: . sodium chloride      No results found for this or any previous visit (from the past 24 hour(s)).   No results found.  ROS:  As stated above in the HPI otherwise negative.  Blood pressure (!) 143/82, pulse (!) 101, temperature 98.5 F (36.9 C), temperature source Oral, resp. rate 15, height 5\' 9"  (1.753 m), weight 112 kg, SpO2 100 %.    PE: Gen: NAD, Alert and Oriented HEENT:  [place]/AT, EOMI Neck: Supple, no LAD Lungs: CTA Bilaterally CV: RRR without M/G/R ABD: Soft, NTND, +BS Ext: No C/C/E  Assessment/Plan: 1) Screening colonoscopy.  Mitchell Castillo D 10/26/2020, 8:46 AM

## 2020-10-26 NOTE — Anesthesia Postprocedure Evaluation (Signed)
Anesthesia Post Note  Patient: Mitchell Castillo  Procedure(s) Performed: COLONOSCOPY WITH PROPOFOL (N/A ) POLYPECTOMY     Patient location during evaluation: Endoscopy Anesthesia Type: MAC Level of consciousness: awake and alert Pain management: pain level controlled Vital Signs Assessment: post-procedure vital signs reviewed and stable Respiratory status: spontaneous breathing, nonlabored ventilation, respiratory function stable and patient connected to nasal cannula oxygen Cardiovascular status: blood pressure returned to baseline and stable Postop Assessment: no apparent nausea or vomiting Anesthetic complications: no   No complications documented.  Last Vitals:  Vitals:   10/26/20 1010 10/26/20 1020  BP: 129/83 132/87  Pulse: 70 63  Resp: 17 15  Temp:    SpO2: 98% 100%    Last Pain:  Vitals:   10/26/20 1020  TempSrc:   PainSc: 0-No pain                 Barnet Glasgow

## 2020-10-26 NOTE — Discharge Instructions (Signed)

## 2020-10-29 ENCOUNTER — Encounter (HOSPITAL_COMMUNITY): Payer: Self-pay | Admitting: Gastroenterology

## 2020-10-29 LAB — SURGICAL PATHOLOGY

## 2020-11-02 ENCOUNTER — Encounter: Payer: Self-pay | Admitting: Family Medicine

## 2020-11-06 ENCOUNTER — Other Ambulatory Visit (HOSPITAL_COMMUNITY): Payer: Self-pay

## 2020-11-09 ENCOUNTER — Other Ambulatory Visit (HOSPITAL_COMMUNITY): Payer: Self-pay

## 2020-11-13 ENCOUNTER — Other Ambulatory Visit (HOSPITAL_COMMUNITY): Payer: Self-pay

## 2020-11-16 ENCOUNTER — Other Ambulatory Visit (HOSPITAL_COMMUNITY): Payer: Self-pay

## 2020-11-19 ENCOUNTER — Other Ambulatory Visit (HOSPITAL_COMMUNITY): Payer: Self-pay

## 2020-11-20 ENCOUNTER — Encounter: Payer: Self-pay | Admitting: Family Medicine

## 2020-11-20 ENCOUNTER — Other Ambulatory Visit (HOSPITAL_COMMUNITY): Payer: Self-pay

## 2020-11-20 MED FILL — Phentermine HCl Cap 37.5 MG: ORAL | 90 days supply | Qty: 90 | Fill #0 | Status: CN

## 2020-11-22 ENCOUNTER — Other Ambulatory Visit (HOSPITAL_COMMUNITY): Payer: Self-pay

## 2020-11-28 ENCOUNTER — Other Ambulatory Visit (HOSPITAL_COMMUNITY): Payer: Self-pay

## 2020-11-28 MED FILL — Phentermine HCl Cap 37.5 MG: ORAL | 90 days supply | Qty: 90 | Fill #0 | Status: AC

## 2021-03-26 ENCOUNTER — Other Ambulatory Visit (HOSPITAL_COMMUNITY): Payer: Self-pay

## 2021-03-26 ENCOUNTER — Ambulatory Visit (INDEPENDENT_AMBULATORY_CARE_PROVIDER_SITE_OTHER): Payer: No Typology Code available for payment source | Admitting: Family Medicine

## 2021-03-26 ENCOUNTER — Other Ambulatory Visit: Payer: Self-pay

## 2021-03-26 ENCOUNTER — Encounter: Payer: Self-pay | Admitting: Family Medicine

## 2021-03-26 VITALS — BP 124/84 | HR 75 | Temp 98.5°F | Wt 235.0 lb

## 2021-03-26 DIAGNOSIS — S39012A Strain of muscle, fascia and tendon of lower back, initial encounter: Secondary | ICD-10-CM | POA: Diagnosis not present

## 2021-03-26 MED ORDER — CYCLOBENZAPRINE HCL 10 MG PO TABS
10.0000 mg | ORAL_TABLET | Freq: Three times a day (TID) | ORAL | 2 refills | Status: AC | PRN
  Filled 2021-03-26: qty 60, 20d supply, fill #0
  Filled 2021-11-14: qty 60, 20d supply, fill #1

## 2021-03-26 MED ORDER — METHYLPREDNISOLONE 4 MG PO TBPK
ORAL_TABLET | ORAL | 0 refills | Status: DC
  Filled 2021-03-26: qty 21, 6d supply, fill #0

## 2021-03-26 NOTE — Progress Notes (Signed)
   Subjective:    Patient ID: Mitchell Castillo, male    DOB: Dec 02, 1970, 50 y.o.   MRN: [id number]  HPI Here for 2 days of severe left sided low back pain that radiates to the left buttock. No recent trauma, but has been carrying loads of wood to help build a deck at this house. He is using heat, Ibuprofen, and Lidocaine patches.    Review of Systems  Constitutional: Negative.   Respiratory: Negative.    Cardiovascular: Negative.   Musculoskeletal:  Positive for back pain.  Neurological:  Negative for weakness and numbness.      Objective:   Physical Exam Constitutional:      Comments: In pain  Cardiovascular:     Rate and Rhythm: Normal rate and regular rhythm.     Pulses: Normal pulses.     Heart sounds: Normal heart sounds.  Pulmonary:     Effort: Pulmonary effort is normal.     Breath sounds: Normal breath sounds.  Musculoskeletal:     Comments: He is mildly tender in the left lower back. Flexion and extension of the lower spine is quite limited  Neurological:     Mental Status: He is alert.          Assessment & Plan:  Lumbar strain. He will take a Medrol dose pack and he may add up to 3000 mg of Tylenol daily. Use Flexeril TID. Recheck as needed.  Gershon Crane, MD

## 2021-04-10 ENCOUNTER — Ambulatory Visit: Payer: No Typology Code available for payment source | Attending: Internal Medicine

## 2021-04-10 ENCOUNTER — Other Ambulatory Visit (HOSPITAL_BASED_OUTPATIENT_CLINIC_OR_DEPARTMENT_OTHER): Payer: Self-pay

## 2021-04-10 DIAGNOSIS — Z23 Encounter for immunization: Secondary | ICD-10-CM

## 2021-04-10 MED ORDER — MODERNA COVID-19 BIVAL BOOSTER 50 MCG/0.5ML IM SUSP
INTRAMUSCULAR | 0 refills | Status: DC
Start: 2021-04-10 — End: 2021-08-05
  Filled 2021-04-10: qty 0.5, 1d supply, fill #0

## 2021-04-10 NOTE — Progress Notes (Signed)
   Covid-19 Vaccination Clinic  Name:  Mitchell Castillo    MRN: [id number] DOB: [birth year]  04/10/2021  Mr. Mitchell Castillo was observed post Covid-19 immunization for 15 minutes without incident. He was provided with Vaccine Information Sheet and instruction to access the V-Safe system.   Mr. Mitchell Castillo was instructed to call 911 with any severe reactions post vaccine: Difficulty breathing  Swelling of face and throat  A fast heartbeat  A bad rash all over body  Dizziness and weakness   Immunizations Administered     Name Date Dose VIS Date Route   Moderna Covid-19 vaccine Bivalent Booster 04/10/2021  9:33 AM 0.5 mL 01/19/2021 Intramuscular   Manufacturer: Moderna   Lot: [id number]   NDC: 23953-202-33

## 2021-08-05 ENCOUNTER — Ambulatory Visit (INDEPENDENT_AMBULATORY_CARE_PROVIDER_SITE_OTHER): Payer: No Typology Code available for payment source | Admitting: Family Medicine

## 2021-08-05 ENCOUNTER — Encounter: Payer: Self-pay | Admitting: Family Medicine

## 2021-08-05 VITALS — BP 110/82 | HR 97 | Temp 98.6°F | Ht 67.25 in | Wt 234.0 lb

## 2021-08-05 DIAGNOSIS — G8929 Other chronic pain: Secondary | ICD-10-CM

## 2021-08-05 DIAGNOSIS — M546 Pain in thoracic spine: Secondary | ICD-10-CM | POA: Diagnosis not present

## 2021-08-05 DIAGNOSIS — Z0000 Encounter for general adult medical examination without abnormal findings: Secondary | ICD-10-CM

## 2021-08-05 LAB — BASIC METABOLIC PANEL
BUN: 13 mg/dL (ref 6–23)
CO2: 30 mEq/L (ref 19–32)
Calcium: 9.2 mg/dL (ref 8.4–10.5)
Chloride: 102 mEq/L (ref 96–112)
Creatinine, Ser: 1.04 mg/dL (ref 0.40–1.50)
GFR: 82.84 mL/min (ref >60.00)
Glucose, Bld: 87 mg/dL (ref 70–99)
Potassium: 4.2 mEq/L (ref 3.5–5.1)
Sodium: 137 mEq/L (ref 135–145)

## 2021-08-05 LAB — CBC WITH DIFFERENTIAL/PLATELET
Basophils Absolute: 0.1 10*3/uL (ref 0.0–0.1)
Basophils Relative: 1.2 % (ref 0.0–3.0)
Eosinophils Absolute: 0.1 10*3/uL (ref 0.0–0.7)
Eosinophils Relative: 1.4 % (ref 0.0–5.0)
HCT: 48.5 % (ref 39.0–52.0)
Hemoglobin: 16.2 g/dL (ref 13.0–17.0)
Lymphocytes Relative: 27.9 % (ref 12.0–46.0)
Lymphs Abs: 1.7 10*3/uL (ref 0.7–4.0)
MCHC: 33.5 g/dL (ref 30.0–36.0)
MCV: 91.5 fl (ref 78.0–100.0)
Monocytes Absolute: 0.6 10*3/uL (ref 0.1–1.0)
Monocytes Relative: 10 % (ref 3.0–12.0)
Neutro Abs: 3.6 10*3/uL (ref 1.4–7.7)
Neutrophils Relative %: 59.5 % (ref 43.0–77.0)
Platelets: 245 10*3/uL (ref 150.0–400.0)
RBC: 5.29 Mil/uL (ref 4.22–5.81)
RDW: 13.2 % (ref 11.5–15.5)
WBC: 6.1 10*3/uL (ref 4.0–10.5)

## 2021-08-05 LAB — HEMOGLOBIN A1C: Hgb A1c MFr Bld: 5.3 % (ref 4.6–6.5)

## 2021-08-05 LAB — HEPATIC FUNCTION PANEL
ALT: 23 U/L (ref 0–53)
AST: 20 U/L (ref 0–37)
Albumin: 4.6 g/dL (ref 3.5–5.2)
Alkaline Phosphatase: 50 U/L (ref 39–117)
Bilirubin, Direct: 0.1 mg/dL (ref 0.0–0.3)
Total Bilirubin: 0.7 mg/dL (ref 0.2–1.2)
Total Protein: 7.4 g/dL (ref 6.0–8.3)

## 2021-08-05 LAB — LIPID PANEL
Cholesterol: 165 mg/dL (ref 0–200)
HDL: 45.4 mg/dL (ref >39.00)
LDL Cholesterol: 103 mg/dL — ABNORMAL HIGH (ref 0–99)
NonHDL: 119.29
Total CHOL/HDL Ratio: 4
Triglycerides: 82 mg/dL (ref 0.0–149.0)
VLDL: 16.4 mg/dL (ref 0.0–40.0)

## 2021-08-05 NOTE — Progress Notes (Signed)
Subjective:    Patient ID: Mitchell Castillo, male    DOB: 05-05-1971, 51 y.o.   MRN: [id number]  HPI Here for a well exam. He is doing well except for the occasional spasm and pain he gets in the middle right back. He has seen PT for this a few years ago, and they diagnosed it as a latissimus dorsi issue. He has tried Ibuprofen and Gabapentin with poor results. He has also tried dry needling with mixed results.    Review of Systems  Constitutional: Negative.   HENT: Negative.    Eyes: Negative.   Respiratory: Negative.    Cardiovascular: Negative.   Gastrointestinal: Negative.   Genitourinary: Negative.   Musculoskeletal:  Positive for back pain.  Skin: Negative.   Neurological: Negative.   Psychiatric/Behavioral: Negative.        Objective:   Physical Exam Constitutional:      General: He is not in acute distress.    Appearance: Normal appearance. He is well-developed. He is not diaphoretic.  HENT:     Head: Normocephalic and atraumatic.     Right Ear: External ear normal.     Left Ear: External ear normal.     Nose: Nose normal.     Mouth/Throat:     Pharynx: No oropharyngeal exudate.  Eyes:     General: No scleral icterus.       Right eye: No discharge.        Left eye: No discharge.     Conjunctiva/sclera: Conjunctivae normal.     Pupils: Pupils are equal, round, and reactive to light.  Neck:     Thyroid: No thyromegaly.     Vascular: No JVD.     Trachea: No tracheal deviation.  Cardiovascular:     Rate and Rhythm: Normal rate and regular rhythm.     Heart sounds: Normal heart sounds. No murmur heard.   No friction rub. No gallop.  Pulmonary:     Effort: Pulmonary effort is normal. No respiratory distress.     Breath sounds: Normal breath sounds. No wheezing or rales.  Chest:     Chest wall: No tenderness.  Abdominal:     General: Bowel sounds are normal. There is no distension.     Palpations: Abdomen is soft. There is no mass.     Tenderness: There is no  abdominal tenderness. There is no guarding or rebound.  Genitourinary:    Penis: Normal. No tenderness.      Testes: Normal.     Prostate: Normal.     Rectum: Normal. Guaiac result negative.  Musculoskeletal:        General: No tenderness. Normal range of motion.     Cervical back: Neck supple.  Lymphadenopathy:     Cervical: No cervical adenopathy.  Skin:    General: Skin is warm and dry.     Coloration: Skin is not pale.     Findings: No erythema or rash.  Neurological:     Mental Status: He is alert and oriented to person, place, and time.     Cranial Nerves: No cranial nerve deficit.     Motor: No abnormal muscle tone.     Coordination: Coordination normal.     Deep Tendon Reflexes: Reflexes are normal and symmetric. Reflexes normal.  Psychiatric:        Behavior: Behavior normal.        Thought Content: Thought content normal.        Judgment: Judgment normal.  Assessment & Plan:  Well exam. We discussed diet and exercise. Get fasting labs. Refer to Sports Medicine for the back pain. Refer to Dr. Danella Sensing for a skin check.  Alysia Penna, MD

## 2021-08-05 NOTE — Patient Instructions (Signed)
Health Maintenance Due  Topic Date Due   HIV Screening  Never done   Hepatitis C Screening  Never done    Depression screen [facility] 2/9 08/05/2021  Decreased Interest 0  Down, Depressed, Hopeless 0  PHQ - 2 Score 0

## 2021-08-06 LAB — TSH: TSH: 1.86 u[IU]/mL (ref 0.35–5.50)

## 2021-08-06 LAB — PSA: PSA: 1.13 ng/mL (ref 0.10–4.00)

## 2021-08-08 NOTE — Progress Notes (Signed)
? ? [name] D.Judd Gaudier ?[facility] ?[address] [place] [address] ?Phone: [phone] ?  ?Assessment and Plan:   ?  ?1. Chronic bilateral thoracic back pain ?2. Somatic dysfunction of thoracic region ?3. Somatic dysfunction of lumbar region ?4. Somatic dysfunction of pelvic region ?5. Somatic dysfunction of rib region ?6. Somatic dysfunction of sacral region ?-Chronic with exacerbation, initial sports medicine visit ?- Multiple musculoskeletal complaints with most prominent being right lower rib cage dysfunction ?- Patient elected to trial OMT today.  Tolerated well per note below. ?- Decision today to treat with OMT was based on Physical Exam ?- Start meloxicam 15 mg daily x2 weeks.  If still having pain after 2 weeks, complete 3rd-week of meloxicam. May use remaining meloxicam as needed once daily for pain control.  Do not to use additional NSAIDs while taking meloxicam.  May use Tylenol [phone] mg to 3 times a day for breakthrough pain.  ?- Start HEP for thoracic stretching ? ? ?After verbal consent patient was treated with HVLA (high velocity low amplitude), ME (muscle energy), FPR (flex positional release), ST (soft tissue), PC/PD (Pelvic Compression/ Pelvic Decompression) techniques in sacrum, rib, thoracic, lumbar, and pelvic areas. Patient tolerated the procedure well with improvement in symptoms.  Patient educated on potential side effects of soreness and recommended to rest, hydrate, and use Tylenol as needed for pain control.  ?  ?Pertinent previous records reviewed include PCP note 08/05/2021, PCP note 03/26/2021, thoracic x-ray 09/03/2020, rib x-ray 09/03/2020 ?  ?Follow Up: 2 weeks for reevaluation.  Could repeat OMT at that time if patient tolerated today's treatment well ?  ?Subjective:   ?I, [name], am serving as a Neurosurgeon for Doctor [employer] ? ?Chief Complaint: chronic right side thoracic pain  ? ?HPI:  ?08/09/2021 ?Patient is a 51 year old male  complaining of right side thoracic pain. 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, pain starts mid spine goes to his mid scapula under the right rib cage up to his sternum when its really bad , has been to PT has had xrays and PT said that he had dysfunctional muscle and trigger points, isnt able to do a sit  up due to pain isnt able to sleep at night when he turns it takes his breath away sometimes , some times the pain feel visceral , when its really flared can feel it when he is driving and hits a bump causes sharp pain, has tried flexeril, NSAIDS, steroids, has a coreo retnathopy, steroid make it flare up really bad , 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 ? 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 ?Additional pertinent review of systems negative. ? ? ?Current Outpatient Medications:  ?  meloxicam (MOBIC) 15 MG tablet, Take 1 tablet (15 mg total) by mouth daily., Disp: 30 tablet, Rfl: 0 ?  acetaminophen (TYLENOL) 500 MG tablet, Take 500-1,000 mg by mouth every 6 (six) hours as needed for moderate pain or mild pain., Disp: , Rfl:  ?  cyclobenzaprine (FLEXERIL) 10 MG tablet, Take 1 tablet (10 mg total) by mouth 3 (three) times daily as needed for muscle spasms., Disp: 60 tablet, Rfl: 2 ?  ibuprofen (ADVIL) 200  MG tablet, Take 200-800 mg by mouth every 6 (six) hours as needed (Back pain)., Disp: , Rfl:  ?  scopolamine (TRANSDERM-SCOP, 1.5 MG,) 1 MG/3DAYS, Place 1 patch (1.5 mg total) onto the skin every 3 (three) days. (Patient taking differently: Place 1 patch onto the skin daily as needed (Sea sickness).), Disp: 10 patch, Rfl: 12 ?  Zoster Vaccine Adjuvanted [facility]) injection, To be administered by the pharmacist, Disp: 0.5 mL, Rfl: 0  ? ?Objective:   ?  ?Vitals:  ? 08/09/21  0827  ?BP: 118/78  ?Pulse: 88  ?SpO2: 98%  ?Weight: 237 lb (107.5 kg)  ?Height: 5\' 7"  (1.702 m)  ?  ?  ?Body mass index is 37.12 kg/m?.  ?  ?Physical Exam:   ? ?Gen: Appears well, nad, nontoxic and pleasant ?Psych: Alert and oriented, appropriate mood and affect ?Neuro: sensation intact, strength is 5/5 in upper and lower extremities, muscle tone wnl ?Skin: no susupicious lesions or rashes ? ?Back - Normal skin, Spine with normal alignment and no deformity.   ?No tenderness to vertebral process palpation.   ?Paraspinous muscles are not tender and without spasm ?Straight leg raise negative ?Trendelenberg negative ? ?  ? ?OMT Physical Exam: ? ?ASIS Compression Test: Positive Right ?Sacrum: Negative sphinx, NTTP sacral bases bilaterally ?Rib: Ribs 6-8 on right stuck in exhalation  ?thoracic: TTP paraspinal, T6-8 RLSR, T3-5 RRSL ?Lumbar: TTP paraspinal, L1-3 RRSL ?Pelvis: Right anterior innominate  ? ? ?Electronically signed by:  ? D.[name] ?[facility] ?9:03 AM 08/09/21 ?

## 2021-08-09 ENCOUNTER — Other Ambulatory Visit: Payer: Self-pay

## 2021-08-09 ENCOUNTER — Ambulatory Visit (INDEPENDENT_AMBULATORY_CARE_PROVIDER_SITE_OTHER): Payer: No Typology Code available for payment source | Admitting: Sports Medicine

## 2021-08-09 ENCOUNTER — Other Ambulatory Visit (HOSPITAL_COMMUNITY): Payer: Self-pay

## 2021-08-09 VITALS — BP 118/78 | HR 88 | Ht 67.0 in | Wt 237.0 lb

## 2021-08-09 DIAGNOSIS — M9902 Segmental and somatic dysfunction of thoracic region: Secondary | ICD-10-CM | POA: Diagnosis not present

## 2021-08-09 DIAGNOSIS — M546 Pain in thoracic spine: Secondary | ICD-10-CM | POA: Diagnosis not present

## 2021-08-09 DIAGNOSIS — G8929 Other chronic pain: Secondary | ICD-10-CM | POA: Diagnosis not present

## 2021-08-09 DIAGNOSIS — M9904 Segmental and somatic dysfunction of sacral region: Secondary | ICD-10-CM

## 2021-08-09 DIAGNOSIS — M9903 Segmental and somatic dysfunction of lumbar region: Secondary | ICD-10-CM

## 2021-08-09 DIAGNOSIS — M9905 Segmental and somatic dysfunction of pelvic region: Secondary | ICD-10-CM

## 2021-08-09 DIAGNOSIS — M9908 Segmental and somatic dysfunction of rib cage: Secondary | ICD-10-CM

## 2021-08-09 MED ORDER — MELOXICAM 15 MG PO TABS
15.0000 mg | ORAL_TABLET | Freq: Every day | ORAL | 0 refills | Status: DC
  Filled 2021-08-09: qty 30, 30d supply, fill #0

## 2021-08-09 NOTE — Patient Instructions (Addendum)
Good to see you  ?- Start meloxicam 15 mg daily x2 weeks.  If still having pain after 2 weeks, complete 3rd-week of meloxicam. May use remaining meloxicam as needed once daily for pain control.  Do not to use additional NSAIDs while taking meloxicam.  May use Tylenol [phone] mg to 3 times a day for breakthrough pain. ?Thoracic HEP  ?2 week follow up for repeat OMT  ?

## 2021-08-21 NOTE — Progress Notes (Signed)
? Mitchell Castillo D.Judd Gaudier ?[facility] ?[address] [place] [address] ?Phone: [phone] ?  ?Assessment and Plan:   ?  ?1. Chronic bilateral thoracic back pain ?2. Somatic dysfunction of thoracic region ?3. Somatic dysfunction of lumbar region ?4. Somatic dysfunction of pelvic region ?-Chronic with exacerbation, subsequent visit ?- Continued multiple musculoskeletal complaints with most prominent being in mid to low back and right lower rib cage, though significantly improved after OMT at previous office visit ?- Continue meloxicam 15 mg x 1 week as patient is going to [place] over the next 1 week and we hope to prevent reaggravation.  Discontinue after 1 week and use remainder as needed for pain ?- Continue HEP ?- Patient has received significant relief with OMT in the past.  Elects for repeat OMT today.  Tolerated well per note below. ?- Decision today to treat with OMT was based on Physical Exam ?  ?After verbal consent patient was treated with HVLA (high velocity low amplitude), ME (muscle energy), FPR (flex positional release), ST (soft tissue), PC/PD (Pelvic Compression/ Pelvic Decompression) techniques in  thoracic, lumbar, and pelvic areas. Patient tolerated the procedure well with improvement in symptoms.  Patient educated on potential side effects of soreness and recommended to rest, hydrate, and use Tylenol as needed for pain control. ?  ?Pertinent previous records reviewed include none ?  ?Follow Up: 2 weeks for repeat OMT ?  ?Subjective:   ?I, Mitchell Castillo, am serving as a Neurosurgeon for Doctor [employer] ? ?Chief Complaint: OMT follow up  ? ?HPI:  ?08/09/2021 ?Patient is a 51 year old male complaining of right side thoracic pain. 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, pain starts mid spine goes to his mid scapula under the right rib cage up to his sternum when its really bad , has been to PT has  had xrays and PT said that he had dysfunctional muscle and trigger points, isnt able to do a sit  up due to pain isnt able to sleep at night when he turns it takes his breath away sometimes , some times the pain feel visceral , when its really flared can feel it when he is driving and hits a bump causes sharp pain, has tried flexeril, NSAIDS, steroids, has a coreo retnathopy, steroid make it flare up really bad , 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  ?08/22/2021 ?Patient states that when he left two weeks ago it was the best he has felt in two years, over the past two days he feels sharp pain in his back at night but it hasn't been spasms way less  ? ? ?  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 ? ?Relevant Historical Information: None ? ?Additional pertinent review of systems negative. ? ?Current Outpatient Medications  ?Medication Sig Dispense Refill  ? acetaminophen (TYLENOL) 500 MG tablet Take 500-1,000 mg by mouth every 6 (six) hours as needed for moderate pain or mild pain.    ? cyclobenzaprine (FLEXERIL) 10 MG tablet Take 1 tablet (10 mg total) by mouth 3 (three) times daily as needed for muscle spasms. 60 tablet 2  ? ibuprofen (ADVIL) 200 MG tablet Take 200-800 mg by mouth every 6 (  six) hours as needed (Back pain).    ? meloxicam (MOBIC) 15 MG tablet Take 1 tablet (15 mg total) by mouth daily. 30 tablet 0  ? scopolamine (TRANSDERM-SCOP, 1.5 MG,) 1 MG/3DAYS Place 1 patch (1.5 mg total) onto the skin every 3 (three) days. (Patient taking differently: Place 1 patch onto the skin daily as needed (Sea sickness).) 10 patch 12  ? Zoster Vaccine Adjuvanted [facility]) injection To be administered by the pharmacist 0.5 mL 0  ? ?No current facility-administered medications for this visit.  ?  ?  ?Objective:   ?  ?Vitals:  ? 08/22/21 1030  ?BP: 136/80   ?Pulse: 65  ?SpO2: 96%  ?Weight: 233 lb (105.7 kg)  ?Height: 5\' 7"  (1.702 m)  ?  ?  ?Body mass index is 36.49 kg/m?.  ?  ?Physical Exam:   ?  ?General: Well-appearing, cooperative, sitting comfortably in no acute distress.  ? ?OMT Physical Exam: ? ?ASIS Compression Test: Positive Right ?  ?Thoracic: TTP paraspinal, T8-11 RRSL ?Lumbar: TTP paraspinal, L1-3 RLSR ?Pelvis: Right anterior innominate ? ?Electronically signed by:  ? D.Mitchell Castillo ?[facility] ?10:51 AM 08/22/21 ?

## 2021-08-22 ENCOUNTER — Ambulatory Visit (INDEPENDENT_AMBULATORY_CARE_PROVIDER_SITE_OTHER): Payer: No Typology Code available for payment source | Admitting: Sports Medicine

## 2021-08-22 ENCOUNTER — Other Ambulatory Visit: Payer: Self-pay

## 2021-08-22 VITALS — BP 136/80 | HR 65 | Ht 67.0 in | Wt 233.0 lb

## 2021-08-22 DIAGNOSIS — M9903 Segmental and somatic dysfunction of lumbar region: Secondary | ICD-10-CM

## 2021-08-22 DIAGNOSIS — G8929 Other chronic pain: Secondary | ICD-10-CM | POA: Diagnosis not present

## 2021-08-22 DIAGNOSIS — M546 Pain in thoracic spine: Secondary | ICD-10-CM

## 2021-08-22 DIAGNOSIS — M9902 Segmental and somatic dysfunction of thoracic region: Secondary | ICD-10-CM | POA: Diagnosis not present

## 2021-08-22 DIAGNOSIS — M9905 Segmental and somatic dysfunction of pelvic region: Secondary | ICD-10-CM

## 2021-08-22 NOTE — Patient Instructions (Addendum)
Good to see you  ?Continue meloxicam for additional 1 week the use remainder as needed ?Continue HEP  ?Follow up in 2 weeks for repeat OMT  ? ?

## 2021-09-11 NOTE — Progress Notes (Signed)
? Mitchell Castillo ?[name] ?[place] ?Phone: [phone] ?  ?Assessment and Plan:   ?  ?1. Chronic bilateral thoracic back pain ?2. Somatic dysfunction of thoracic region ?3. Somatic dysfunction of lumbar region ?4. Somatic dysfunction of pelvic region ?-Chronic with exacerbation, subsequent visit ?- Recurrence of multiple musculoskeletal complaints with most prominent being in right-sided thoracolumbar junction, likely flared due to patient's recent trip to [facility] ?- May restart taking meloxicam daily as needed for pain relief ?- Patient has received significant relief with OMT in the past.  Elects for repeat OMT today.  Tolerated well per note below. ?- Decision today to treat with OMT was based on Physical Exam ?  ?After verbal consent patient was treated with HVLA (high velocity low amplitude), ME (muscle energy), FPR (flex positional release), ST (soft tissue), PC/PD (Pelvic Compression/ Pelvic Decompression) techniques in  thoracic, lumbar, and pelvic areas. Patient tolerated the procedure well with improvement in symptoms.  Patient educated on potential side effects of soreness and recommended to rest, hydrate, and use Tylenol as needed for pain control. ?  ?Pertinent previous records reviewed include none ?  ?Follow Up: A 2-week follow-up for repeat OMT ?  ?Subjective:   ?I, Mitchell Castillo, am serving as a Education administrator for Doctor [employer] ? ?Chief Complaint: back pain  ? ?HPI:  ?08/09/2021 ?Patient is a 51 year old male complaining of right side thoracic pain. 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, pain starts mid spine goes to his mid scapula under the right rib cage up to his sternum when its really bad , has been to PT has had xrays and PT said that he had dysfunctional muscle and trigger points, isnt able to do a sit  up due to pain isnt able to sleep at night when he turns  it takes his breath away sometimes , some times the pain feel visceral , when its really flared can feel it when he is driving and hits a bump causes sharp pain, has tried flexeril, NSAIDS, steroids, has a coreo retnathopy, steroid make it flare up really bad , 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  ?08/22/2021 ?Patient states that when he left two weeks ago it was the best he has felt in two years, over the past two days he feels sharp pain in his back at night but it hasn't been spasms way less  ?  ?09/12/2021 ?Patient states that he's okay, went to [place] stayed in the camper carried 51 year old and 5 year around [place], had to take a mobic a couple of time,pain is worse at night time when he is laying down  ? ?  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 ?Additional pertinent review of systems negative. ? ?Current Outpatient Medications  ?Medication Sig Dispense Refill  ? acetaminophen (TYLENOL) 500 MG tablet Take 500-1,000 mg by mouth every 6 (six) hours as needed for moderate pain or mild pain.    ? cyclobenzaprine (FLEXERIL) 10 MG tablet Take 1 tablet (10 mg total) by mouth 3 (three) times daily as needed for muscle spasms. 60 tablet 2  ? ibuprofen (ADVIL) 200 MG tablet Take 200-800 mg by  mouth every 6 (six) hours as needed (Back pain).    ? meloxicam (MOBIC) 15 MG tablet Take 1 tablet (15 mg total) by mouth daily. 30 tablet 0  ? scopolamine (TRANSDERM-SCOP, 1.5 MG,) 1 MG/3DAYS Place 1 patch (1.5 mg total) onto the skin every 3 (three) days. (Patient taking differently: Place 1 patch onto the skin daily as needed (Sea sickness).) 10 patch 12  ? Zoster Vaccine Adjuvanted [facility]) injection To be administered by the pharmacist 0.5 mL 0  ? ?No current facility-administered medications for this visit.  ?  ?  ?Objective:   ?  ?Vitals:  ? 09/12/21 1044   ?BP: 136/80  ?Pulse: 61  ?SpO2: 99%  ?Weight: 237 lb (107.5 kg)  ?Height: 5\' 7"  (1.702 m)  ?  ?  ?Body mass index is 37.12 kg/m?.  ?  ?Physical Exam:   ?  ?General: Well-appearing, cooperative, sitting comfortably in no acute distress.  ? ?OMT Physical Exam: ? ?ASIS Compression Test: Positive Right ?  ?Thoracic: TTP paraspinal, T8-L2 RRSL ?Lumbar: TTP paraspinal, T8-L2 RRSL ?Pelvis: Right anterior innominate ? ?Electronically signed by:  ?Mitchell Castillo ?[facility] ?11:04 AM 09/12/21 ?

## 2021-09-12 ENCOUNTER — Ambulatory Visit (INDEPENDENT_AMBULATORY_CARE_PROVIDER_SITE_OTHER): Payer: No Typology Code available for payment source | Admitting: Sports Medicine

## 2021-09-12 VITALS — BP 136/80 | HR 61 | Ht 67.0 in | Wt 237.0 lb

## 2021-09-12 DIAGNOSIS — M9902 Segmental and somatic dysfunction of thoracic region: Secondary | ICD-10-CM

## 2021-09-12 DIAGNOSIS — M9903 Segmental and somatic dysfunction of lumbar region: Secondary | ICD-10-CM

## 2021-09-12 DIAGNOSIS — M546 Pain in thoracic spine: Secondary | ICD-10-CM | POA: Diagnosis not present

## 2021-09-12 DIAGNOSIS — G8929 Other chronic pain: Secondary | ICD-10-CM | POA: Diagnosis not present

## 2021-09-12 DIAGNOSIS — M9905 Segmental and somatic dysfunction of pelvic region: Secondary | ICD-10-CM | POA: Diagnosis not present

## 2021-09-12 NOTE — Patient Instructions (Addendum)
Good to see you  ?Can use meloxicam as needed ?2 week follow up for repeat OMT  ?

## 2021-09-25 NOTE — Progress Notes (Signed)
? Mitchell Castillo Mitchell Castillo ? Sports Medicine ?[address] [place] [address] ?Phone: [phone] ?  ?Assessment and Plan:   ?1. Chronic bilateral thoracic back pain ?3. Somatic dysfunction of lumbar region ?4. Somatic dysfunction of thoracic region ?5. Somatic dysfunction of pelvic region ?-Chronic with exacerbation, subsequent visit ?- Recurrence of multiple musculoskeletal complaints with most prominent being middle back and lower back ?- Start Tylenol 500 to 1000 mg nightly to help decrease nightly pain ?- Patient has received significant relief with OMT in the past.  Elects for repeat OMT today.  Tolerated well per note below. ?- Decision today to treat with OMT was based on Physical Exam ?  ?After verbal consent patient was treated with HVLA (high velocity low amplitude), ME (muscle energy), FPR (flex positional release), ST (soft tissue), PC/PD (Pelvic Compression/ Pelvic Decompression) techniques in cervical, rib, thoracic, lumbar, and pelvic areas. Patient tolerated the procedure well with improvement in symptoms.  Patient educated on potential side effects of soreness and recommended to rest, hydrate, and use Tylenol as needed for pain control. ?  ?2. Polyarthralgia ?6.  Bilateral hand numbness and tingling ?-Chronic with exacerbation ?- Due to ongoing multiple musculoskeletal complaints with only mild relief with conservative therapy, and bilateral intermittent finger tingling.  Will further investigate and rule out inflammatory conditions that could be contributing to symptoms ?- VITAMIN D 25 Hydroxy (Vit-D Deficiency, Fractures); Future ?- B12 ?- ANA; Future ?- C-reactive protein ?- Sedimentation rate; Future ?- Ferritin; Future  ? ?Pertinent previous records reviewed include CBC 08/05/2021, BMP 08/05/2021, TSH 08/05/2021, A1c 08/05/2021 ?  ?Follow Up: 3 weeks for reevaluation and review of lab work.  Could consider repeat OMT versus trigger point injections versus PT referral ?  ?Subjective:    ?I, Mitchell Castillo, am serving as a Neurosurgeon for Doctor [employer] ? ?Chief Complaint: back pain  ?  ?HPI:  ?08/09/2021 ?Patient is a 51 year old male complaining of right side thoracic pain. 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, pain starts mid spine goes to his mid scapula under the right rib cage up to his sternum when its really bad , has been to PT has had xrays and PT said that he had dysfunctional muscle and trigger points, isnt able to do a sit  up due to pain isnt able to sleep at night when he turns it takes his breath away sometimes , some times the pain feel visceral , when its really flared can feel it when he is driving and hits a bump causes sharp pain, has tried flexeril, NSAIDS, steroids, has a coreo retnathopy, steroid make it flare up really bad , 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  ?08/22/2021 ?Patient states that when he left two weeks ago it was the best he has felt in two years, over the past two days he feels sharp pain in his back at night but it hasn't been spasms way less  ?  ?09/12/2021 ?Patient states that he's okay, went [place] stayed in the camper carried 51 year old and 5 year around [place], had to take a mobic a couple of time,pain is worse at night time when he is laying down  ?  ?09/26/2021 ?Patient states he is okay  he seems to always flare himself up once he gets going through out  the day he's okay, but when he lays to go sleep its the worst ? ? 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 ?Additional pertinent review of systems negative. ? ?Current Outpatient Medications  ?Medication Sig Dispense Refill  ? acetaminophen (TYLENOL) 500 MG tablet Take 500-1,000 mg by mouth every 6 (six) hours as needed for moderate pain or mild pain.    ?  cyclobenzaprine (FLEXERIL) 10 MG tablet Take 1 tablet (10 mg total) by mouth 3 (three) times daily as needed for muscle spasms. 60 tablet 2  ? ibuprofen (ADVIL) 200 MG tablet Take 200-800 mg by mouth every 6 (six) hours as needed (Back pain).    ? meloxicam (MOBIC) 15 MG tablet Take 1 tablet (15 mg total) by mouth daily. 30 tablet 0  ? scopolamine (TRANSDERM-SCOP, 1.5 MG,) 1 MG/3DAYS Place 1 patch (1.5 mg total) onto the skin every 3 (three) days. (Patient taking differently: Place 1 patch onto the skin daily as needed (Sea sickness).) 10 patch 12  ? Zoster Vaccine Adjuvanted [facility] injection To be administered by the pharmacist 0.5 mL 0  ? ?No current facility-administered medications for this visit.  ?  ?  ?Objective:   ?  ?Vitals:  ? 09/26/21 1011  ?BP: 130/80  ?Pulse: 89  ?SpO2: 98%  ?Weight: 239 lb (108.4 kg)  ?Height: 5\' 7"  (1.702 m)  ?  ?  ?Body mass index is 37.43 kg/m?.  ?  ?Physical Exam:   ?  ?General: Well-appearing, cooperative, sitting comfortably in no acute distress.  ? ?OMT Physical Exam: ? ?ASIS Compression Test: Positive Right ?  ?Thoracic: TTP paraspinal, T6-9 RRSL ?Lumbar: TTP paraspinal, L1-3 RRSL ?Pelvis: Right anterior innominate ? ?Cervical Spine: Posture normal ?Skin: normal, intact ? ?Neurological:  ? ?Strength: ? Right  Left   ?Deltoid 5/5 5/5  ?Bicep 5/5  5/5  ?Tricep 5/5 5/5  ?Wrist Flexion 5/5 5/5  ?Wrist Extension 5/5 5/5  ?Grip 5/5 5/5  ?Finger Abduction 5/5 5/5  ? ?Sensation: intact to light touch in upper extremities bilaterally ? ?Spurling's:  negative bilaterally ?Neck ROM: Full active ROM ?TTP: Thoracic paraspinal and trapezius ?NTTP: cervical paraspinal, cervical spinous processes  ? ?Electronically signed by:  ? D.Mitchell Castillo ?[facility] ?10:47 AM 09/26/21 ?

## 2021-09-26 ENCOUNTER — Ambulatory Visit (INDEPENDENT_AMBULATORY_CARE_PROVIDER_SITE_OTHER): Payer: No Typology Code available for payment source | Admitting: Sports Medicine

## 2021-09-26 VITALS — BP 130/80 | HR 89 | Ht 67.0 in | Wt 239.0 lb

## 2021-09-26 DIAGNOSIS — M9903 Segmental and somatic dysfunction of lumbar region: Secondary | ICD-10-CM

## 2021-09-26 DIAGNOSIS — M546 Pain in thoracic spine: Secondary | ICD-10-CM

## 2021-09-26 DIAGNOSIS — R200 Anesthesia of skin: Secondary | ICD-10-CM

## 2021-09-26 DIAGNOSIS — M2550 Pain in unspecified joint: Secondary | ICD-10-CM

## 2021-09-26 DIAGNOSIS — M9902 Segmental and somatic dysfunction of thoracic region: Secondary | ICD-10-CM

## 2021-09-26 DIAGNOSIS — G8929 Other chronic pain: Secondary | ICD-10-CM

## 2021-09-26 DIAGNOSIS — M9905 Segmental and somatic dysfunction of pelvic region: Secondary | ICD-10-CM

## 2021-09-26 LAB — VITAMIN D 25 HYDROXY (VIT D DEFICIENCY, FRACTURES): VITD: 26.08 ng/mL — ABNORMAL LOW (ref 30.00–100.00)

## 2021-09-26 LAB — SEDIMENTATION RATE: Sed Rate: 5 mm/hr (ref 0–20)

## 2021-09-26 LAB — VITAMIN B12: Vitamin B-12: 292 pg/mL (ref 211–911)

## 2021-09-26 LAB — C-REACTIVE PROTEIN: CRP: <1 mg/dL (ref 0.5–20.0)

## 2021-09-26 LAB — FERRITIN: Ferritin: 312.8 ng/mL (ref 22.0–322.0)

## 2021-09-26 NOTE — Patient Instructions (Addendum)
Good to see you  ?Labs on the way out  ?3 week follow up  ?

## 2021-09-28 LAB — ANTI-NUCLEAR AB-TITER (ANA TITER)
ANA TITER: 1:40 {titer} — ABNORMAL HIGH
ANA Titer 1: 1:80 {titer} — ABNORMAL HIGH

## 2021-09-28 LAB — ANA: Anti Nuclear Antibody (ANA): POSITIVE — AB

## 2021-09-30 ENCOUNTER — Other Ambulatory Visit: Payer: Self-pay | Admitting: Sports Medicine

## 2021-09-30 DIAGNOSIS — R768 Other specified abnormal immunological findings in serum: Secondary | ICD-10-CM

## 2021-09-30 NOTE — Progress Notes (Signed)
Referral to Rheumatology was put in  ?

## 2021-09-30 NOTE — Progress Notes (Unsigned)
a 

## 2021-10-09 NOTE — Progress Notes (Signed)
? Benito Mccreedy D.Merril Abbe ?[facility] ?[place] ?Phone: [phone] ?  ?Assessment and Plan:   ?  ?1. Positive ANA (antinuclear antibody) ?2. Chronic bilateral thoracic back pain ?3. Polyarthralgia ?-Chronic with exacerbation, subsequent visit ?- Discussed patient's positive ANA with nuclear speckled and nuclear homogeneous patterns in a 1: 40 and 1: 80 titer level.  These low-level titers with unremarkable ESR, CRP, ferritin make me feel that there is not an active autoimmune or rheumatologic problem causing patient's symptoms, though I believe the patient should be further evaluated by rheumatology. ?- We will start medication treatment plan to help decrease chronic pains as followed: ?Start tylenol 500 mg in the morning ,500 mg mid day ,1000 mg at night  ?Start ibuprofen 400-600 mg as needed for pain flares no more than three times a week  ?Continue flexeril 5-10 mg nightly for muscle spasms no  more than three times a week  ?  ?Pertinent previous records reviewed include recent lab work ?  ?Follow Up: 4 weeks for reevaluation to see if conservative medication treatment plan is sufficient for pain control.  Could consider repeat OMT ?  ?Subjective:   ? ?I, Pincus Badder, am serving as a Education administrator for Doctor [employer] ? ?Chief Complaint: back pain  ?  ?HPI:  ?08/09/2021 ?Patient is a 51 year old male complaining of right side thoracic pain. 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, pain starts mid spine goes to his mid scapula under the right rib cage up to his sternum when its really bad , has been to PT has had xrays and PT said that he had dysfunctional muscle and trigger points, isnt able to do a sit  up due to pain isnt able to sleep at night when he turns it takes his breath away sometimes , some times the pain feel visceral , when its really flared can feel it when he is driving and hits a  bump causes sharp pain, has tried flexeril, NSAIDS, steroids, has a coreo retnathopy, steroid make it flare up really bad , 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  ?08/22/2021 ?Patient states that when he left two weeks ago it was the best he has felt in two years, over the past two days he feels sharp pain in his back at night but it hasn't been spasms way less  ?  ?09/12/2021 ?Patient states that he's okay, went [place] stayed in the camper carried 51 year old and 5 [place], had to take a mobic a couple of time,pain is worse at night time when he is laying down  ?  ?09/26/2021 ?Patient states he is okay he seems to always flare himself up once he gets going through out  the day he's okay, but when he lays to go sleep its the worst ?  ?10/11/2021 ?Patient states that he is okay , worried about lab results will look into more rheumatologist  ? ? 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 ?Additional pertinent review of systems negative. ? ?Current Outpatient Medications  ?Medication Sig Dispense Refill  ? acetaminophen (TYLENOL) 500 MG tablet Take 500-1,000 mg by mouth every 6 (six) hours as  needed for moderate pain or mild pain.    ? cyclobenzaprine (FLEXERIL) 10 MG tablet Take 1 tablet (10 mg total) by mouth 3 (three) times daily as needed for muscle spasms. 60 tablet 2  ? ibuprofen (ADVIL) 200 MG tablet Take 200-800 mg by mouth every 6 (six) hours as needed (Back pain).    ? scopolamine (TRANSDERM-SCOP, 1.5 MG,) 1 MG/3DAYS Place 1 patch (1.5 mg total) onto the skin every 3 (three) days. (Patient taking differently: Place 1 patch onto the skin daily as needed (Sea sickness).) 10 patch 12  ? meloxicam (MOBIC) 15 MG tablet Take 1 tablet (15 mg total) by mouth daily. 30 tablet 0  ? Zoster Vaccine Adjuvanted [facility]) injection To be administered by  the pharmacist 0.5 mL 0  ? ?No current facility-administered medications for this visit.  ?  ?  ?Objective:   ?  ?Vitals:  ? 10/11/21 0922  ?BP: 136/80  ?Pulse: 81  ?SpO2: 96%  ?Weight: 239 lb (108.4 kg)  ?Height: 5' 7"  (1.702 m)  ?  ?  ?Body mass index is 37.43 kg/m?.  ?  ?Physical Exam:   ?  ?Gen: Appears well, nad, nontoxic and pleasant ?Psych: Alert and oriented, appropriate mood and affect ?Neuro: sensation intact, strength is 5/5 in upper and lower extremities, muscle tone wnl ?Skin: no susupicious lesions or rashes ? ?Back - Normal skin, Spine with normal alignment and no deformity.   ?No tenderness to vertebral process palpation.   ?Paraspinous muscles are   tender and without spasm ?Straight leg raise negative ?Trendelenberg negative  ? ?Electronically signed by:  ?Benito Mccreedy D.Merril Abbe ?[facility] ?10:10 AM 10/11/21             ?

## 2021-10-11 ENCOUNTER — Ambulatory Visit (INDEPENDENT_AMBULATORY_CARE_PROVIDER_SITE_OTHER): Payer: No Typology Code available for payment source | Admitting: Sports Medicine

## 2021-10-11 VITALS — BP 136/80 | HR 81 | Ht 67.0 in | Wt 239.0 lb

## 2021-10-11 DIAGNOSIS — M546 Pain in thoracic spine: Secondary | ICD-10-CM | POA: Diagnosis not present

## 2021-10-11 DIAGNOSIS — R768 Other specified abnormal immunological findings in serum: Secondary | ICD-10-CM | POA: Diagnosis not present

## 2021-10-11 DIAGNOSIS — G8929 Other chronic pain: Secondary | ICD-10-CM | POA: Diagnosis not present

## 2021-10-11 DIAGNOSIS — M2550 Pain in unspecified joint: Secondary | ICD-10-CM

## 2021-10-11 NOTE — Patient Instructions (Addendum)
Good to see you  ?Start tylenol 500 mg in the morning ,500 mg mid day ,1000 mg at night  ?Start ibuprofen 400-600 mg as needed for pain flares no more than three times a week  ?Continue flexeril 5-10 mg nightly for muscle spasms no  more than three times a week  ?4 week follow up  ?

## 2021-10-23 NOTE — Progress Notes (Signed)
Office Visit Note  Patient: Mitchell Castillo             Date of Birth: 29-Dec-1970           MRN: [id number]             PCP: Laurey Morale, MD Referring: Glennon Mac, DO Visit Date: 10/31/2021 Occupation: @GUAROCC @  Subjective:  Thoracic pain, positive ANA   History of Present Illness: Mitchell Castillo is a 51 y.o. male seen in consultation per request of Dr. Glennon Mac.  According to the patient about 2 years ago he started experiencing right sided thoracic pain all the way from the thoracic region to his right flank.  He states pain intermittently radiates to his right rib cage.  He was seen by Dr. Sarajane Jews who referred him to physical therapy where he had dry needling and it improved his symptoms temporarily and then the symptoms recurred.  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  He also is active with fishing and swimming and lifts weights at the gym.  He states that the pain persist over time and has been worse when he rotates his spine and rolls over in the bed at night.  He went for a follow up visit to Dr. Sarajane Jews in February 2023.  At that time Dr. Sarajane Jews referred him to Dr. Glennon Mac who did manipulations and also gave him Tylenol and meloxicam which helped for short time.  Dr. Glennon Mac obtain some lab work and his ANA was positive.  He denies any history of oral ulcers, nasal ulcers, malar rash, photosensitivity, sicca symptoms, Raynaud's phenomenon or lymphadenopathy.  There is no history of inflammatory arthritis.  He has had some discomfort in his left knee joint due to prior ACL tear.  He is also had some discomfort in his ankles at times.  He states recently he is experiencing burning sensation in his left wrist joint.  He intermittently feels numbness in his both arms.  He had shingles in 2020 in the left sciatic nerve since then he has been experiencing some postherpetic neuralgia and numbness in his left thigh.  There is no family history of autoimmune disease.  There  is no personal history of psoriasis, Achilles tendinitis, Planter fasciitis or uveitis.  Activities of Daily Living:  Patient reports morning stiffness for 15 minutes.   Patient Reports nocturnal pain.  Difficulty dressing/grooming: Denies Difficulty climbing stairs: Denies Difficulty getting out of chair: Denies Difficulty using hands for taps, buttons, cutlery, and/or writing: Denies  Review of Systems  Constitutional:  Negative for fatigue, weight gain and weight loss.  HENT:  Negative for mouth sores and mouth dryness.   Eyes:  Negative for dryness.  Respiratory:  Negative for shortness of breath.   Cardiovascular:  Negative for chest pain and palpitations.  Gastrointestinal:  Negative for abdominal pain, constipation and diarrhea.  Endocrine: Negative for increased urination.  Genitourinary:  Negative for pelvic pain.  Musculoskeletal:  Positive for joint pain, joint pain, myalgias, morning stiffness and myalgias.  Skin:  Negative for color change, rash and sensitivity to sunlight.  Allergic/Immunologic: Negative for susceptible to infections.  Neurological:  Positive for parasthesias. Negative for dizziness and headaches.  Hematological:  Negative for swollen glands.  Psychiatric/Behavioral:  Negative for depressed mood and sleep disturbance. The patient is not nervous/anxious.    PMFS History:  Patient Active Problem List   Diagnosis Date Noted   Chronic right-sided thoracic back pain 08/05/2021  OSA (obstructive sleep apnea) 05/29/2016   Eczema 07/04/2013   FOOT PAIN 12/18/2008    Past Medical History:  Diagnosis Date   Allergy    Central serous chorioretinopathy of eye, left    OSA (obstructive sleep apnea) 05/29/2016   uses CPAP     Family History  Problem Relation Age of Onset   Diabetes Mother    Breast cancer Mother    Multiple myeloma Father    Colon cancer Maternal Grandmother    Heart attack Paternal Uncle    Past Surgical History:  Procedure  Laterality Date   CHOLECYSTECTOMY     COLONOSCOPY WITH PROPOFOL N/A 10/26/2020   per Dr. Benson Norway, hyperplastic polyp, repeat in 64yr   POLYPECTOMY  10/26/2020   Procedure: POLYPECTOMY;  Surgeon: HCarol Ada MD;  Location: [facility];  Service: Endoscopy;;   VASECTOMY     Social History   Social History Narrative   Not on file   Immunization History  Administered Date(s) Administered   Influenza Split 03/31/2016   Influenza,inj,Quad PF,6+ Mos 03/14/2021   Influenza-Unspecified 02/27/2015, 03/06/2017, 03/22/2018, 02/18/2019, 03/08/2020   Moderna Covid-19 Vaccine Bivalent Booster 13yr& up 04/10/2021   PFIZER(Purple Top)SARS-COV-2 Vaccination 06/01/2019, 06/20/2019, 03/16/2020   Td 10/31/2014   Zoster Recombinat (Shingrix) 04/24/2020, 10/16/2020     Objective: Vital Signs: BP 136/90 (BP Location: Left Arm, Patient Position: Sitting, Cuff Size: Large)   Pulse 75   Resp 12   Ht 5' 8.5" (1.74 m)   Wt 240 lb 12.8 oz (109.2 kg)   BMI 36.08 kg/m    Physical Exam Vitals and nursing note reviewed.  Constitutional:      Appearance: He is well-developed.  HENT:     Head: Normocephalic and atraumatic.  Eyes:     Conjunctiva/sclera: Conjunctivae normal.     Pupils: Pupils are equal, round, and reactive to light.  Cardiovascular:     Rate and Rhythm: Normal rate and regular rhythm.     Heart sounds: Normal heart sounds.  Pulmonary:     Effort: Pulmonary effort is normal.     Breath sounds: Normal breath sounds.  Abdominal:     General: Bowel sounds are normal.     Palpations: Abdomen is soft.  Musculoskeletal:     Cervical back: Normal range of motion and neck supple.  Skin:    General: Skin is warm and dry.     Capillary Refill: Capillary refill takes less than 2 seconds.  Neurological:     Mental Status: He is alert and oriented to person, place, and time.  Psychiatric:        Behavior: Behavior normal.     Musculoskeletal Exam: C-spine was in good range of motion.   Thoracic spine was in good range of motion with discomfort on lateral rotation.  Lumbar spine was in good range of motion.  He had no point tenderness.  There was no tenderness over SI joints.  Schober's was negative.  Shoulder joints, elbow joints, wrist joints, MCPs PIPs and DIPs with good range of motion.  He had bilateral DIP thickening.  He had tenderness over left wrist joint over the ulnar aspect.  Hip joints and knee joints in good range of motion without any warmth swelling or effusion.  There was mild tenderness over left ankle joint without any synovitis.  There was thickening of bilateral first PIP joint of the great toe.  CDAI Exam: CDAI Score: -- Patient Global: --; Provider Global: -- Swollen: --; Tender: -- Joint Exam  10/31/2021   No joint exam has been documented for this visit   There is currently no information documented on the homunculus. Go to the Rheumatology activity and complete the homunculus joint exam.  Investigation: No additional findings.  Imaging: XR Hand 2 View Left  Result Date: 10/31/2021 No MCP, intercarpal or radiocarpal joint space narrowing was noted.  Mild CMC, PIP and DIP narrowing was noted.  No erosive changes were noted. 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  XR Hand 2 View Right  Result Date: 10/31/2021 No MCP, intercarpal or radiocarpal joint space narrowing was noted.  Mild CMC, PIP and DIP narrowing was noted.  No erosive changes were noted. 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   Recent Labs: Lab Results  Component Value Date   WBC 6.1 08/05/2021   HGB 16.2 08/05/2021   PLT 245.0 08/05/2021   NA 137 08/05/2021   K 4.2 08/05/2021   CL 102 08/05/2021   CO2 30 08/05/2021   GLUCOSE 87 08/05/2021   BUN 13 08/05/2021   CREATININE 1.04 08/05/2021   BILITOT 0.7 08/05/2021   ALKPHOS 50 08/05/2021   AST 20 08/05/2021   ALT 23 08/05/2021   PROT 7.4  08/05/2021   ALBUMIN 4.6 08/05/2021   CALCIUM 9.2 08/05/2021    Speciality Comments: No specialty comments available.  Procedures:  No procedures performed Allergies: Patient has no known allergies.   Assessment / Plan:     Visit Diagnoses: Positive ANA (antinuclear antibody) - 09/26/21: ANA 1:80NS, 1:40NH, CRP<1, ESR 5, Vitamin D 26.08, vitamin B12 292, ferritin 312.8 -he has low titer positive ANA.  There is no history of oral ulcers, nasal ulcers, malar rash, sicca symptoms, Raynaud's phenomenon, lymphadenopathy, inflammatory arthritis.  There is no family history of autoimmune disease.  I had a detailed discussion with the patient about positive ANA and its presence in the 15% of normal population.  To complete the work-up I will obtain additional labs.  Plan: Anti-scleroderma antibody, RNP Antibody, Anti-Smith antibody, Sjogrens syndrome-A extractable nuclear antibody, Sjogrens syndrome-B extractable nuclear antibody, Anti-DNA antibody, double-stranded, C3 and C4, ANA  Chronic right-sided thoracic back pain-he has been experiencing severe thoracic pain for the last 2 years.  The pain is gradually getting worse.  He has nocturnal pain.  He is having insomnia due to thoracic pain.  He states the pain radiates into his right rib cage.  He also has been experiencing numbness in his hands.  I reviewed his x-rays from September 03, 2020 which showed right-sided osteophytes.  Viewed the x-ray findings with the patient.  He has tried physical therapy and manipulations without much relief.  He is also taken meloxicam without much relief.  I will schedule MRI of the thoracic spine to evaluate this further.  He had good mobility in his lumbar spine and no tenderness over SI joints.  Schober's was negative.  Stretching exercises were demonstrated in the office and a handout on back exercises was given.  I encouraged him to continue to swim.  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  Pain in both hands -has been having discomfort in his hands especially over his left wrist joint.  He had bilateral DIP mild thickening consistent with early osteoarthritic changes.  Plan: XR Hand 2 View Left, XR Hand 2 View Right.  X-rays showed early osteoarthritic changes.  Chronic pain of both ankles-he complains  of discomfort in his bilateral ankles.  He states the pain is intermittent.  No synovitis was noted.  There was no evidence of Achilles tendinitis or planter fasciitis.  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  Other eczema-he gives history of episodic eczema which is seasonal.  Vitamin D deficiency-he was recently diagnosed with vitamin D deficiency and he is taking vitamin D.  OSA (obstructive sleep apnea)-he uses CPAP at nighttime.  Orders: Orders Placed This Encounter  Procedures   XR Hand 2 View Left   XR Hand 2 View Right   MR THORACIC SPINE WO CONTRAST   Anti-scleroderma antibody   RNP Antibody   Anti-Smith antibody   Sjogrens syndrome-A extractable nuclear antibody   Sjogrens syndrome-B extractable nuclear antibody   Anti-DNA antibody, double-stranded   C3 and C4   ANA   No orders of the defined types were placed in this encounter.    Follow-Up Instructions: Return for Thoracic pain, positive ANA.   Bo Merino, MD  Note - This record has been created using Editor, commissioning.  Chart creation errors have been sought, but may not always  have been located. Such creation errors do not reflect on  the standard of medical care.

## 2021-10-29 ENCOUNTER — Encounter: Payer: Self-pay | Admitting: Family Medicine

## 2021-10-30 NOTE — Telephone Encounter (Signed)
That should not be an issue at all

## 2021-10-31 ENCOUNTER — Ambulatory Visit (INDEPENDENT_AMBULATORY_CARE_PROVIDER_SITE_OTHER): Payer: No Typology Code available for payment source

## 2021-10-31 ENCOUNTER — Encounter: Payer: Self-pay | Admitting: Rheumatology

## 2021-10-31 ENCOUNTER — Ambulatory Visit (INDEPENDENT_AMBULATORY_CARE_PROVIDER_SITE_OTHER): Payer: No Typology Code available for payment source | Admitting: Rheumatology

## 2021-10-31 VITALS — BP 136/90 | HR 75 | Resp 12 | Ht 68.5 in | Wt 240.8 lb

## 2021-10-31 DIAGNOSIS — M79641 Pain in right hand: Secondary | ICD-10-CM | POA: Diagnosis not present

## 2021-10-31 DIAGNOSIS — M25571 Pain in right ankle and joints of right foot: Secondary | ICD-10-CM | POA: Diagnosis not present

## 2021-10-31 DIAGNOSIS — M79642 Pain in left hand: Secondary | ICD-10-CM | POA: Diagnosis not present

## 2021-10-31 DIAGNOSIS — G8929 Other chronic pain: Secondary | ICD-10-CM

## 2021-10-31 DIAGNOSIS — M546 Pain in thoracic spine: Secondary | ICD-10-CM

## 2021-10-31 DIAGNOSIS — R768 Other specified abnormal immunological findings in serum: Secondary | ICD-10-CM

## 2021-10-31 DIAGNOSIS — G4733 Obstructive sleep apnea (adult) (pediatric): Secondary | ICD-10-CM

## 2021-10-31 DIAGNOSIS — E559 Vitamin D deficiency, unspecified: Secondary | ICD-10-CM

## 2021-10-31 DIAGNOSIS — M25572 Pain in left ankle and joints of left foot: Secondary | ICD-10-CM

## 2021-10-31 DIAGNOSIS — L308 Other specified dermatitis: Secondary | ICD-10-CM

## 2021-10-31 DIAGNOSIS — H35712 Central serous chorioretinopathy, left eye: Secondary | ICD-10-CM

## 2021-10-31 NOTE — Patient Instructions (Signed)

## 2021-11-05 LAB — ANA: Anti Nuclear Antibody (ANA): POSITIVE — AB

## 2021-11-05 LAB — SJOGRENS SYNDROME-A EXTRACTABLE NUCLEAR ANTIBODY: SSA (Ro) (ENA) Antibody, IgG: <1 AI

## 2021-11-05 LAB — ANTI-NUCLEAR AB-TITER (ANA TITER): ANA Titer 1: 1:40 {titer} — ABNORMAL HIGH

## 2021-11-05 LAB — C3 AND C4
C3 Complement: 146 mg/dL (ref 82–185)
C4 Complement: 35 mg/dL (ref 15–53)

## 2021-11-05 LAB — ANTI-SCLERODERMA ANTIBODY: Scleroderma (Scl-70) (ENA) Antibody, IgG: <1 AI

## 2021-11-05 LAB — ANTI-DNA ANTIBODY, DOUBLE-STRANDED: ds DNA Ab: 4 IU/mL

## 2021-11-05 LAB — RNP ANTIBODY: Ribonucleic Protein(ENA) Antibody, IgG: <1 AI

## 2021-11-05 LAB — SJOGRENS SYNDROME-B EXTRACTABLE NUCLEAR ANTIBODY: SSB (La) (ENA) Antibody, IgG: <1 AI

## 2021-11-05 LAB — ANTI-SMITH ANTIBODY: ENA SM Ab Ser-aCnc: <1 AI

## 2021-11-05 NOTE — Progress Notes (Signed)
Labs are unremarkable.  I will discuss results at the follow-up visit.

## 2021-11-10 ENCOUNTER — Ambulatory Visit (HOSPITAL_COMMUNITY): Admission: RE | Admit: 2021-11-10 | Payer: No Typology Code available for payment source | Source: Ambulatory Visit

## 2021-11-10 ENCOUNTER — Ambulatory Visit (HOSPITAL_COMMUNITY)
Admission: RE | Admit: 2021-11-10 | Discharge: 2021-11-10 | Disposition: A | Discharge status: Home / Self Care | Payer: No Typology Code available for payment source | Source: Ambulatory Visit | Attending: Rheumatology | Admitting: Rheumatology

## 2021-11-10 DIAGNOSIS — M546 Pain in thoracic spine: Secondary | ICD-10-CM | POA: Diagnosis present

## 2021-11-10 DIAGNOSIS — G8929 Other chronic pain: Secondary | ICD-10-CM | POA: Insufficient documentation

## 2021-11-11 NOTE — Progress Notes (Signed)
MRI is consistent with degenerative changes.  I will discuss results at the follow-up visit.

## 2021-11-13 ENCOUNTER — Ambulatory Visit (HOSPITAL_COMMUNITY): Payer: No Typology Code available for payment source

## 2021-11-13 NOTE — Progress Notes (Signed)
[name] [facility] [address] [place] [address] Phone: [phone]   Assessment and Plan:     1. Positive ANA (antinuclear antibody) 2. Chronic bilateral thoracic back pain 3. Polyarthralgia -Chronic with exacerbation, subsequent visit - No change in chronic mid back pain - Patient established care with rheumatology.  Bilateral hand x-rays and thoracic spine MRI which showed mild degenerative changes, and otherwise were unremarkable.  Patient's autoimmune lab work has shown low positive ANA with most recent titer 1: 40, and otherwise negative autoimmune work-up - Recommend starting physical therapy for low back, core strengthening to decrease flares of low and mid back pain - Start Tylenol 500 to 1000 mg tablets 2-3 times a day for day-to-day pain relief - May use NSAIDs as needed for breakthrough pain  Pertinent previous records reviewed include rheumatology note 10/31/2021, bilateral hand x-ray 5/25-23, thoracic spine MRI 11/11/2021   Follow Up: As needed if no improvement or worsening of symptoms   Subjective:   I, Moenique Parris, am serving as a Neurosurgeon for Doctor [employer]  Chief Complaint: back pain    HPI:  08/09/2021 Patient is a 51 year old male complaining of right side thoracic pain. 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, pain starts mid spine goes to his mid scapula under the right rib cage up to his sternum when its really bad , has been to PT has had xrays and PT said that he had dysfunctional muscle and trigger points, isnt able to do a sit  up due to pain isnt able to sleep at night when he turns it takes his breath away sometimes , some times the pain feel visceral , when its really flared can feel it when he is driving and hits a bump causes sharp pain, has tried flexeril, NSAIDS, steroids, has a coreo retnathopy, steroid make it flare up really bad ,  has a hx 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 nerve , can take gabapentin for 2-3 day takes the edge off but it makes him feel bad , can discuss OMT    08/22/2021 Patient states that when he left two weeks ago it was the best he has felt in two years, over the past two days he feels sharp pain in his back at night but it hasn't been spasms way less    09/12/2021 Patient states that he's okay, went to [place] stayed in the camper carried 51 year old and 5 year around [place], had to take a mobic a couple of time,pain is worse at night time when he is laying down    09/26/2021 Patient states he is okay he seems to always flare himself up once he gets going through out  the day he's okay, but when he lays to go sleep its the worst   10/11/2021 Patient states that he is okay , worried about lab results will look into more rheumatologist   11/14/2021 Patient states alright same ole same , was able to see rheumatology      Relevant Historical Information: History of shingles that affected right lower extremity and sciatica  Additional pertinent review of systems negative.  Current Outpatient Medications  Medication Sig Dispense Refill   acetaminophen (TYLENOL) 500 MG tablet Take 500-1,000 mg by mouth every 6 (six) hours as needed for moderate pain or mild pain.     Cholecalciferol (VITAMIN D3)  125 MCG (5000 UT) TABS Take 5,000 Units by mouth daily.     cyclobenzaprine (FLEXERIL) 10 MG tablet Take 1 tablet (10 mg total) by mouth 3 (three) times daily as needed for muscle spasms. (Patient taking differently: Take 10 mg by mouth as needed for muscle spasms.) 60 tablet 2   ibuprofen (ADVIL) 200 MG tablet Take 200-800 mg by mouth every 6 (six) hours as needed (Back pain).     Multiple Vitamin (MULTIVITAMIN) tablet Take 1 tablet by mouth daily.     scopolamine (TRANSDERM-SCOP, 1.5 MG,) 1 MG/3DAYS Place 1 patch (1.5 mg total) onto the skin every 3 (three) days. (Patient taking differently: Place 1 patch onto the skin  daily as needed (Sea sickness).) 10 patch 12   No current facility-administered medications for this visit.      Objective:     Vitals:   11/14/21 0953  BP: 120/80  Pulse: 83  SpO2: 99%  Weight: 240 lb (108.9 kg)  Height: 5\' 8"  (1.727 m)      Body mass index is 36.49 kg/m.    Physical Exam:     Gen: Appears well, nad, nontoxic and pleasant Psych: Alert and oriented, appropriate mood and affect Neuro: sensation intact, strength is 5/5 in upper and lower extremities, muscle tone wnl Skin: no susupicious lesions or rashes   Back - Normal skin, Spine with normal alignment and no deformity.   No tenderness to vertebral process palpation.   Paraspinous muscles are   tender and without spasm Straight leg raise negative Trendelenberg negative   Electronically signed by:  [name] 10:19 AM 11/14/21

## 2021-11-14 ENCOUNTER — Ambulatory Visit (INDEPENDENT_AMBULATORY_CARE_PROVIDER_SITE_OTHER): Payer: No Typology Code available for payment source | Admitting: Sports Medicine

## 2021-11-14 ENCOUNTER — Other Ambulatory Visit (HOSPITAL_COMMUNITY): Payer: Self-pay

## 2021-11-14 VITALS — BP 120/80 | HR 83 | Ht 68.0 in | Wt 240.0 lb

## 2021-11-14 DIAGNOSIS — R768 Other specified abnormal immunological findings in serum: Secondary | ICD-10-CM | POA: Diagnosis not present

## 2021-11-14 DIAGNOSIS — M545 Low back pain, unspecified: Secondary | ICD-10-CM | POA: Diagnosis not present

## 2021-11-14 DIAGNOSIS — G8929 Other chronic pain: Secondary | ICD-10-CM

## 2021-11-14 DIAGNOSIS — M546 Pain in thoracic spine: Secondary | ICD-10-CM

## 2021-11-14 DIAGNOSIS — M2550 Pain in unspecified joint: Secondary | ICD-10-CM | POA: Diagnosis not present

## 2021-11-14 NOTE — Patient Instructions (Addendum)
Good to see you  Pt referral low back and core Tylenol [phone] mg 2-3 times a day for pain relief  NSAIDs as needed for breakthrough pain  As needed follow up

## 2021-11-28 ENCOUNTER — Ambulatory Visit: Payer: No Typology Code available for payment source | Admitting: Physical Therapy

## 2021-12-13 NOTE — Progress Notes (Unsigned)
Office Visit Note  Patient: Mitchell Castillo             Date of Birth: [birth year]           MRN: [id number]             PCP: Laurey Morale, MD Referring: Laurey Morale, MD Visit Date: 12/17/2021 Occupation: @GUAROCC @  Subjective:  Thoracic pain  History of Present Illness: Mitchell Castillo is a 51 y.o. male with history of positive ANA, thoracic pain and pain in joints.  He returns for a follow-up visit.  He states he went to see a sports medicine doctor who referred him to a physical therapist but she has not made an appointment yet.  He continues to have severe thoracic pain off-and-on for the last 2 years.  He recently had MRI of his thoracic spine.  He states he continues to have some discomfort in his hands and ankles.  He notices occasional numbness in his hands.  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    Activities of Daily Living:  Patient reports morning stiffness for a few minutes.   Patient Reports nocturnal pain.  Difficulty dressing/grooming: Denies Difficulty climbing stairs: Denies Difficulty getting out of chair: Denies Difficulty using hands for taps, buttons, cutlery, and/or writing: Denies  Review of Systems  Constitutional:  Negative for fatigue.  HENT:  Negative for mouth sores, mouth dryness and nose dryness.   Eyes:  Negative for pain, itching and dryness.  Respiratory:  Negative for shortness of breath and difficulty breathing.   Cardiovascular:  Negative for chest pain and palpitations.  Gastrointestinal:  Negative for blood in stool, constipation and diarrhea.  Endocrine: Negative for increased urination.  Genitourinary:  Negative for difficulty urinating.  Musculoskeletal:  Positive for joint pain, joint pain, myalgias, morning stiffness and myalgias. Negative for joint swelling and muscle tenderness.  Skin:  Negative for color change, rash and redness.  Allergic/Immunologic: Negative for susceptible to infections.  Neurological:  Negative for  dizziness, numbness, headaches, memory loss and weakness.  Hematological:  Negative for bruising/bleeding tendency.  Psychiatric/Behavioral:  Negative for confusion.     PMFS History:  Patient Active Problem List   Diagnosis Date Noted   Chronic right-sided thoracic back pain 08/05/2021   OSA (obstructive sleep apnea) 05/29/2016   Eczema 07/04/2013   FOOT PAIN 12/18/2008    Past Medical History:  Diagnosis Date   Allergy    Central serous chorioretinopathy of eye, left    OSA (obstructive sleep apnea) 05/29/2016   uses CPAP     Family History  Problem Relation Age of Onset   Diabetes Mother    Breast cancer Mother    Multiple myeloma Father    Colon cancer Maternal Grandmother    Heart attack Paternal Uncle    Past Surgical History:  Procedure Laterality Date   CHOLECYSTECTOMY     COLONOSCOPY WITH PROPOFOL N/A 10/26/2020   per Dr. Benson Norway, hyperplastic polyp, repeat in 52yr   POLYPECTOMY  10/26/2020   Procedure: POLYPECTOMY;  Surgeon: HCarol Ada MD;  [facility];  Service: Endoscopy;;   VASECTOMY     Social History   Social History Narrative   Not on file   Immunization History  Administered Date(s) Administered   Influenza Split 03/31/2016   Influenza,inj,Quad PF,6+ Mos 03/14/2021   Influenza-Unspecified 02/27/2015, 03/06/2017, 03/22/2018, 02/18/2019, 03/08/2020   Moderna Covid-19 Vaccine Bivalent Booster 198yr& up 04/10/2021   PFIZER(Purple Top)SARS-COV-2 Vaccination 06/01/2019,  06/20/2019, 03/16/2020   Td 10/31/2014   Zoster Recombinat (Shingrix) 04/24/2020, 10/16/2020     Objective: Vital Signs: BP (!) 146/83 (BP Location: Left Arm, Patient Position: Sitting, Cuff Size: Normal)   Pulse 73   Ht 5' 9"  (1.753 m)   Wt 240 lb 6.4 oz (109 kg)   BMI 35.50 kg/m    Physical Exam Vitals and nursing note reviewed.  Constitutional:      Appearance: He is well-developed.  HENT:     Head: Normocephalic and atraumatic.  Eyes:      Conjunctiva/sclera: Conjunctivae normal.     Pupils: Pupils are equal, round, and reactive to light.  Cardiovascular:     Rate and Rhythm: Normal rate and regular rhythm.     Heart sounds: Normal heart sounds.  Pulmonary:     Effort: Pulmonary effort is normal.     Breath sounds: Normal breath sounds.  Abdominal:     General: Bowel sounds are normal.     Palpations: Abdomen is soft.  Musculoskeletal:     Cervical back: Normal range of motion and neck supple.  Skin:    General: Skin is warm and dry.     Capillary Refill: Capillary refill takes less than 2 seconds.  Neurological:     Mental Status: He is alert and oriented to person, place, and time.  Psychiatric:        Behavior: Behavior normal.      Musculoskeletal Exam: C-spine, thoracic and lumbar spine were in good range of motion.  He had discomfort with lateral rotation of the lumbar spine.  Shoulder joints, elbow joints, wrist joints, MCPs PIPs and DIPs with good range of motion with minimal DIP thickening.  Hip joints and knee joints with good range of motion without any warmth swelling or effusion.  There was no evidence of Achilles tendinitis or planter fasciitis.  CDAI Exam: CDAI Score: -- Patient Global: --; Provider Global: -- Swollen: --; Tender: -- Joint Exam 12/17/2021   No joint exam has been documented for this visit   There is currently no information documented on the homunculus. Go to the Rheumatology activity and complete the homunculus joint exam.  Investigation: No additional findings.  Imaging: No results found.  Recent Labs: Lab Results  Component Value Date   WBC 6.1 08/05/2021   HGB 16.2 08/05/2021   PLT 245.0 08/05/2021   NA 137 08/05/2021   K 4.2 08/05/2021   CL 102 08/05/2021   CO2 30 08/05/2021   GLUCOSE 87 08/05/2021   BUN 13 08/05/2021   CREATININE 1.04 08/05/2021   BILITOT 0.7 08/05/2021   ALKPHOS 50 08/05/2021   AST 20 08/05/2021   ALT 23 08/05/2021   PROT 7.4 08/05/2021    ALBUMIN 4.6 08/05/2021   CALCIUM 9.2 08/05/2021   Oct 31, 2021 ANA 1: 40NS, ENA negative, C3-C4 normal  Speciality Comments: No specialty comments available.  Procedures:  No procedures performed Allergies: Patient has no known allergies.   Assessment / Plan:     Visit Diagnoses: Positive ANA (antinuclear antibody) - ANA low titer positive, ENA negative, complements normal.  No clinical features of autoimmune disease.  Patient denies any history of oral ulcers, nasal ulcers, malar rash, photosensitivity, sicca symptoms or Raynaud's phenomenon.  He had no synovitis on examination.  Lab findings were discussed with the patient at length.  Chronic right-sided thoracic back pain - History of severe thoracic pain for 2 years.  November 11, 2021 MRI of the thoracic spine showed mild  facet joint arthropathy from T7-T10.  MRI findings were discussed with the patient.  He also had x-rays in the past which showed right-sided osteophytes.  He will benefit from physical therapy.  He will also benefit from swimming and stretching exercises.  Some of the exercises were demonstrated in the office.  He has good posture and good mobility in his spine except for lateral rotation.  I advised him to avoid lifting heavy patients and heavy objects.  Primary osteoarthritis of both hands - Clinical and radiographic findings are consistent with osteoarthritis.  Joint protection muscle strengthening was discussed.  Chronic pain of both ankles - No synovitis was noted.  There was no evidence of Achilles tendinitis or planter fasciitis.  Central serous chorioretinopathy of left eye  Vitamin D deficiency-he is currently on vitamin D 5000 units daily.  Other eczema  OSA (obstructive sleep apnea)-on CPAP  Orders: No orders of the defined types were placed in this encounter.  No orders of the defined types were placed in this encounter.    Follow-Up Instructions: Return if symptoms worsen or fail to improve, for  Osteoarthritis.   Bo Merino, MD  Note - This record has been created using Editor, commissioning.  Chart creation errors have been sought, but may not always  have been located. Such creation errors do not reflect on  the standard of medical care.

## 2021-12-17 ENCOUNTER — Ambulatory Visit (INDEPENDENT_AMBULATORY_CARE_PROVIDER_SITE_OTHER): Payer: No Typology Code available for payment source | Admitting: Rheumatology

## 2021-12-17 ENCOUNTER — Encounter: Payer: Self-pay | Admitting: Rheumatology

## 2021-12-17 VITALS — BP 146/83 | HR 73 | Ht 69.0 in | Wt 240.4 lb

## 2021-12-17 DIAGNOSIS — E559 Vitamin D deficiency, unspecified: Secondary | ICD-10-CM

## 2021-12-17 DIAGNOSIS — M19041 Primary osteoarthritis, right hand: Secondary | ICD-10-CM | POA: Diagnosis not present

## 2021-12-17 DIAGNOSIS — R768 Other specified abnormal immunological findings in serum: Secondary | ICD-10-CM

## 2021-12-17 DIAGNOSIS — M25571 Pain in right ankle and joints of right foot: Secondary | ICD-10-CM | POA: Diagnosis not present

## 2021-12-17 DIAGNOSIS — L308 Other specified dermatitis: Secondary | ICD-10-CM

## 2021-12-17 DIAGNOSIS — G4733 Obstructive sleep apnea (adult) (pediatric): Secondary | ICD-10-CM

## 2021-12-17 DIAGNOSIS — M25572 Pain in left ankle and joints of left foot: Secondary | ICD-10-CM

## 2021-12-17 DIAGNOSIS — M19042 Primary osteoarthritis, left hand: Secondary | ICD-10-CM

## 2021-12-17 DIAGNOSIS — M546 Pain in thoracic spine: Secondary | ICD-10-CM | POA: Diagnosis not present

## 2021-12-17 DIAGNOSIS — G8929 Other chronic pain: Secondary | ICD-10-CM

## 2021-12-17 DIAGNOSIS — H35712 Central serous chorioretinopathy, left eye: Secondary | ICD-10-CM

## 2022-04-03 ENCOUNTER — Ambulatory Visit: Payer: No Typology Code available for payment source | Admitting: Rheumatology

## 2022-06-08 ENCOUNTER — Other Ambulatory Visit: Payer: Self-pay | Admitting: Family Medicine

## 2022-06-11 ENCOUNTER — Other Ambulatory Visit (HOSPITAL_COMMUNITY): Payer: Self-pay

## 2022-06-11 MED ORDER — SCOPOLAMINE 1 MG/3DAYS TD PT72
1.0000 | MEDICATED_PATCH | TRANSDERMAL | 5 refills | Status: AC
  Filled 2022-06-11 – 2022-07-21 (×2): qty 10, 30d supply, fill #0
  Filled 2022-08-26: qty 10, 30d supply, fill #1
  Filled 2022-10-20: qty 10, 30d supply, fill #2
  Filled 2023-03-02: qty 10, 30d supply, fill #3

## 2022-06-11 NOTE — Telephone Encounter (Signed)
Last OV CPE-08/05/21 No future OV scheduled.

## 2022-06-12 ENCOUNTER — Other Ambulatory Visit (HOSPITAL_COMMUNITY): Payer: Self-pay

## 2022-06-18 ENCOUNTER — Other Ambulatory Visit (HOSPITAL_COMMUNITY): Payer: Self-pay

## 2022-06-23 ENCOUNTER — Other Ambulatory Visit (HOSPITAL_COMMUNITY): Payer: Self-pay

## 2022-07-09 DIAGNOSIS — H5203 Hypermetropia, bilateral: Secondary | ICD-10-CM | POA: Diagnosis not present

## 2022-07-10 ENCOUNTER — Other Ambulatory Visit (HOSPITAL_COMMUNITY): Payer: Self-pay

## 2022-07-10 MED ORDER — ERYTHROMYCIN 5 MG/GM OP OINT
1.0000 | TOPICAL_OINTMENT | Freq: Three times a day (TID) | OPHTHALMIC | 0 refills | Status: DC
  Filled 2022-07-10: qty 3.5, 10d supply, fill #0

## 2022-07-21 ENCOUNTER — Other Ambulatory Visit (HOSPITAL_BASED_OUTPATIENT_CLINIC_OR_DEPARTMENT_OTHER): Payer: Self-pay

## 2022-08-04 ENCOUNTER — Other Ambulatory Visit (HOSPITAL_BASED_OUTPATIENT_CLINIC_OR_DEPARTMENT_OTHER): Payer: Self-pay

## 2022-08-04 DIAGNOSIS — H35712 Central serous chorioretinopathy, left eye: Secondary | ICD-10-CM | POA: Diagnosis not present

## 2022-08-04 DIAGNOSIS — H33323 Round hole, bilateral: Secondary | ICD-10-CM | POA: Diagnosis not present

## 2022-08-04 MED ORDER — BROMFENAC SODIUM 0.07 % OP SOLN
1.0000 [drp] | Freq: Every day | OPHTHALMIC | 1 refills | Status: DC
  Filled 2022-08-04: qty 3, 30d supply, fill #0
  Filled 2022-08-26 – 2022-09-08 (×3): qty 3, 30d supply, fill #1

## 2022-08-05 ENCOUNTER — Other Ambulatory Visit (HOSPITAL_BASED_OUTPATIENT_CLINIC_OR_DEPARTMENT_OTHER): Payer: Self-pay

## 2022-08-11 DIAGNOSIS — D225 Melanocytic nevi of trunk: Secondary | ICD-10-CM | POA: Diagnosis not present

## 2022-08-11 DIAGNOSIS — L814 Other melanin hyperpigmentation: Secondary | ICD-10-CM | POA: Diagnosis not present

## 2022-08-11 DIAGNOSIS — D2221 Melanocytic nevi of right ear and external auricular canal: Secondary | ICD-10-CM | POA: Diagnosis not present

## 2022-08-11 DIAGNOSIS — L821 Other seborrheic keratosis: Secondary | ICD-10-CM | POA: Diagnosis not present

## 2022-08-11 DIAGNOSIS — D2361 Other benign neoplasm of skin of right upper limb, including shoulder: Secondary | ICD-10-CM | POA: Diagnosis not present

## 2022-08-11 DIAGNOSIS — B353 Tinea pedis: Secondary | ICD-10-CM | POA: Diagnosis not present

## 2022-08-11 DIAGNOSIS — L905 Scar conditions and fibrosis of skin: Secondary | ICD-10-CM | POA: Diagnosis not present

## 2022-08-26 ENCOUNTER — Other Ambulatory Visit (HOSPITAL_BASED_OUTPATIENT_CLINIC_OR_DEPARTMENT_OTHER): Payer: Self-pay

## 2022-08-26 DIAGNOSIS — H35712 Central serous chorioretinopathy, left eye: Secondary | ICD-10-CM | POA: Diagnosis not present

## 2022-08-26 DIAGNOSIS — H33323 Round hole, bilateral: Secondary | ICD-10-CM | POA: Diagnosis not present

## 2022-08-27 ENCOUNTER — Other Ambulatory Visit: Payer: Self-pay

## 2022-08-28 ENCOUNTER — Other Ambulatory Visit (HOSPITAL_BASED_OUTPATIENT_CLINIC_OR_DEPARTMENT_OTHER): Payer: Self-pay

## 2022-08-29 ENCOUNTER — Other Ambulatory Visit (HOSPITAL_BASED_OUTPATIENT_CLINIC_OR_DEPARTMENT_OTHER): Payer: Self-pay

## 2022-09-08 ENCOUNTER — Other Ambulatory Visit (HOSPITAL_BASED_OUTPATIENT_CLINIC_OR_DEPARTMENT_OTHER): Payer: Self-pay

## 2022-10-21 NOTE — Progress Notes (Unsigned)
    Mitchell Castillo [facility] [address] [place] [address] Phone: [phone]   Assessment and Plan:     There are no diagnoses linked to this encounter.  ***   Pertinent previous records reviewed include ***   Follow Up: ***     Subjective:   I, Mitchell Castillo, am serving as a Neurosurgeon for Mitchell Castillo  Chief Complaint: right foot pain   HPI:   10/22/2022 Patient is  51 year old male complaining of right foot pain. Patient states  Relevant Historical Information: ***  Additional pertinent review of systems negative.   Current Outpatient Medications:    acetaminophen (TYLENOL) 500 MG tablet, Take 500-1,000 mg by mouth every 6 (six) hours as needed for moderate pain or mild pain., Disp: , Rfl:    Bromfenac Sodium (PROLENSA) 0.07 % SOLN, Administer 1 drop into the left eye daily., Disp: 3 mL, Rfl: 1   Cholecalciferol (VITAMIN D3) 125 MCG (5000 UT) TABS, Take 5,000 Units by mouth daily., Disp: , Rfl:    cyclobenzaprine (FLEXERIL) 10 MG tablet, Take 1 tablet (10 mg total) by mouth 3 (three) times daily as needed for muscle spasms. (Patient taking differently: Take 10 mg by mouth as needed for muscle spasms.), Disp: 60 tablet, Rfl: 2   erythromycin ophthalmic ointment, Apply a small amount to external eyelid as directed three times daily for 10 days., Disp: 3.5 g, Rfl: 0   ibuprofen (ADVIL) 200 MG tablet, Take 200-800 mg by mouth every 6 (six) hours as needed (Back pain)., Disp: , Rfl:    Multiple Vitamin (MULTIVITAMIN) tablet, Take 1 tablet by mouth daily., Disp: , Rfl:    scopolamine (TRANSDERM-SCOP) 1 MG/3DAYS, Place 1 patch onto the skin every 3 days., Disp: 10 patch, Rfl: 5   Objective:     There were no vitals filed for this visit.    There is no height or weight on file to calculate BMI.    Physical Exam:    ***   Electronically signed by:  Mitchell Castillo [facility] 7:19 AM 10/21/22

## 2022-10-22 ENCOUNTER — Other Ambulatory Visit (HOSPITAL_BASED_OUTPATIENT_CLINIC_OR_DEPARTMENT_OTHER): Payer: Self-pay

## 2022-10-22 ENCOUNTER — Ambulatory Visit: Payer: Commercial Managed Care - PPO | Admitting: Sports Medicine

## 2022-10-22 VITALS — BP 132/80 | HR 86 | Ht 69.0 in | Wt 240.0 lb

## 2022-10-22 DIAGNOSIS — M67471 Ganglion, right ankle and foot: Secondary | ICD-10-CM

## 2022-10-22 MED ORDER — MELOXICAM 15 MG PO TABS
15.0000 mg | ORAL_TABLET | Freq: Every day | ORAL | 0 refills | Status: DC
  Filled 2022-10-22: qty 30, 30d supply, fill #0

## 2022-10-22 NOTE — Patient Instructions (Addendum)
Good to see you  - Start meloxicam 15 mg daily x2 weeks.  If still having pain after 2 weeks, complete 3rd-week of meloxicam. May use remaining meloxicam as needed once daily for pain control.  Do not to use additional NSAIDs while taking meloxicam.  May use Tylenol [phone] mg 2 to 3 times a day for breakthrough pain. Voltaren gel over areas of pain  3-4 week follow up

## 2022-11-14 NOTE — Progress Notes (Unsigned)
    [name] [facility] [address] [place] [address] Phone: [phone]   Assessment and Plan:     There are no diagnoses linked to this encounter.  ***   Pertinent previous records reviewed include ***   Follow Up: ***     Subjective:   I, Jaymond Waage, am serving as a Neurosurgeon for Doctor Richardean Sale   Chief Complaint: right foot pain    HPI:    10/22/2022 Patient is  52 year old male complaining of right foot pain. [place] of his foot thinks it been there for years, has had some foot pain over the years. Not TTP when he presses on it, will feel numbness sometimes, he notes the bump is sizeable , pain kind of pain down to his toes, can feel it when he has shoes on , intermittent tylenol and ibu,   11/17/2022 Patient states   Relevant Historical Information: Positive ANA Additional pertinent review of systems negative.   Current Outpatient Medications:    acetaminophen (TYLENOL) 500 MG tablet, Take 500-1,000 mg by mouth every 6 (six) hours as needed for moderate pain or mild pain., Disp: , Rfl:    Bromfenac Sodium (PROLENSA) 0.07 % SOLN, Administer 1 drop into the left eye daily., Disp: 3 mL, Rfl: 1   Cholecalciferol (VITAMIN D3) 125 MCG (5000 UT) TABS, Take 5,000 Units by mouth daily., Disp: , Rfl:    cyclobenzaprine (FLEXERIL) 10 MG tablet, Take 1 tablet (10 mg total) by mouth 3 (three) times daily as needed for muscle spasms. (Patient taking differently: Take 10 mg by mouth as needed for muscle spasms.), Disp: 60 tablet, Rfl: 2   erythromycin ophthalmic ointment, Apply a small amount to external eyelid as directed three times daily for 10 days., Disp: 3.5 g, Rfl: 0   ibuprofen (ADVIL) 200 MG tablet, Take 200-800 mg by mouth every 6 (six) hours as needed (Back pain)., Disp: , Rfl:    meloxicam (MOBIC) 15 MG tablet, Take 1 tablet (15 mg total) by mouth daily., Disp: 30 tablet,  Rfl: 0   Multiple Vitamin (MULTIVITAMIN) tablet, Take 1 tablet by mouth daily., Disp: , Rfl:    scopolamine (TRANSDERM-SCOP) 1 MG/3DAYS, Place 1 patch onto the skin every 3 days., Disp: 10 patch, Rfl: 5   Objective:     There were no vitals filed for this visit.    There is no height or weight on file to calculate BMI.    Physical Exam:    ***   Electronically signed by:  [name] [facility] 12:55 PM 11/14/22

## 2022-11-17 ENCOUNTER — Ambulatory Visit: Payer: Commercial Managed Care - PPO | Admitting: Sports Medicine

## 2022-11-17 VITALS — HR 81 | Ht 69.0 in | Wt 247.0 lb

## 2022-11-17 DIAGNOSIS — M67471 Ganglion, right ankle and foot: Secondary | ICD-10-CM | POA: Diagnosis not present

## 2022-12-04 ENCOUNTER — Ambulatory Visit: Payer: Commercial Managed Care - PPO | Admitting: Family Medicine

## 2023-01-07 ENCOUNTER — Encounter (INDEPENDENT_AMBULATORY_CARE_PROVIDER_SITE_OTHER): Payer: Self-pay

## 2023-01-27 ENCOUNTER — Encounter: Payer: Commercial Managed Care - PPO | Admitting: Family Medicine

## 2023-01-27 ENCOUNTER — Encounter: Payer: Self-pay | Admitting: Family Medicine

## 2023-01-27 ENCOUNTER — Other Ambulatory Visit: Payer: Self-pay

## 2023-01-27 ENCOUNTER — Ambulatory Visit (INDEPENDENT_AMBULATORY_CARE_PROVIDER_SITE_OTHER): Payer: Commercial Managed Care - PPO | Admitting: Family Medicine

## 2023-01-27 ENCOUNTER — Other Ambulatory Visit (HOSPITAL_BASED_OUTPATIENT_CLINIC_OR_DEPARTMENT_OTHER): Payer: Self-pay

## 2023-01-27 VITALS — BP 124/82 | HR 63 | Temp 98.1°F | Ht 68.0 in | Wt 255.0 lb

## 2023-01-27 DIAGNOSIS — Z0000 Encounter for general adult medical examination without abnormal findings: Secondary | ICD-10-CM

## 2023-01-27 DIAGNOSIS — H35719 Central serous chorioretinopathy, unspecified eye: Secondary | ICD-10-CM | POA: Insufficient documentation

## 2023-01-27 DIAGNOSIS — H33323 Round hole, bilateral: Secondary | ICD-10-CM | POA: Diagnosis not present

## 2023-01-27 DIAGNOSIS — H35712 Central serous chorioretinopathy, left eye: Secondary | ICD-10-CM | POA: Diagnosis not present

## 2023-01-27 DIAGNOSIS — H2513 Age-related nuclear cataract, bilateral: Secondary | ICD-10-CM | POA: Diagnosis not present

## 2023-01-27 LAB — CBC WITH DIFFERENTIAL/PLATELET
Basophils Absolute: 0.1 10*3/uL (ref 0.0–0.1)
Basophils Relative: 1.4 % (ref 0.0–3.0)
Eosinophils Absolute: 0.1 10*3/uL (ref 0.0–0.7)
Eosinophils Relative: 1.7 % (ref 0.0–5.0)
HCT: 46.9 % (ref 39.0–52.0)
Hemoglobin: 15.7 g/dL (ref 13.0–17.0)
Lymphocytes Relative: 21 % (ref 12.0–46.0)
Lymphs Abs: 1.5 10*3/uL (ref 0.7–4.0)
MCHC: 33.4 g/dL (ref 30.0–36.0)
MCV: 92.6 fl (ref 78.0–100.0)
Monocytes Absolute: 0.8 10*3/uL (ref 0.1–1.0)
Monocytes Relative: 10.5 % (ref 3.0–12.0)
Neutro Abs: 4.7 10*3/uL (ref 1.4–7.7)
Neutrophils Relative %: 65.4 % (ref 43.0–77.0)
Platelets: 246 10*3/uL (ref 150.0–400.0)
RBC: 5.07 Mil/uL (ref 4.22–5.81)
RDW: 13.7 % (ref 11.5–15.5)
WBC: 7.1 10*3/uL (ref 4.0–10.5)

## 2023-01-27 LAB — LIPID PANEL
Cholesterol: 195 mg/dL (ref 0–200)
HDL: 45.5 mg/dL (ref >39.00)
LDL Cholesterol: 124 mg/dL — ABNORMAL HIGH (ref 0–99)
NonHDL: 149.22
Total CHOL/HDL Ratio: 4
Triglycerides: 126 mg/dL (ref 0.0–149.0)
VLDL: 25.2 mg/dL (ref 0.0–40.0)

## 2023-01-27 LAB — HEPATIC FUNCTION PANEL
ALT: 21 U/L (ref 0–53)
AST: 18 U/L (ref 0–37)
Albumin: 4.4 g/dL (ref 3.5–5.2)
Alkaline Phosphatase: 52 U/L (ref 39–117)
Bilirubin, Direct: 0.1 mg/dL (ref 0.0–0.3)
Total Bilirubin: 0.8 mg/dL (ref 0.2–1.2)
Total Protein: 7.3 g/dL (ref 6.0–8.3)

## 2023-01-27 LAB — HEMOGLOBIN A1C: Hgb A1c MFr Bld: 5.4 % (ref 4.6–6.5)

## 2023-01-27 LAB — BASIC METABOLIC PANEL
BUN: 14 mg/dL (ref 6–23)
CO2: 27 mEq/L (ref 19–32)
Calcium: 9.3 mg/dL (ref 8.4–10.5)
Chloride: 103 mEq/L (ref 96–112)
Creatinine, Ser: 0.97 mg/dL (ref 0.40–1.50)
GFR: 89.13 mL/min (ref >60.00)
Glucose, Bld: 94 mg/dL (ref 70–99)
Potassium: 4.2 mEq/L (ref 3.5–5.1)
Sodium: 137 mEq/L (ref 135–145)

## 2023-01-27 LAB — PSA: PSA: 1.56 ng/mL (ref 0.10–4.00)

## 2023-01-27 LAB — TSH: TSH: 0.98 u[IU]/mL (ref 0.35–5.50)

## 2023-01-27 MED ORDER — BROMFENAC SODIUM 0.07 % OP SOLN
1.0000 [drp] | Freq: Every day | OPHTHALMIC | 10 refills | Status: AC
  Filled 2023-01-27: qty 3, 30d supply, fill #0
  Filled 2023-03-02: qty 3, 30d supply, fill #1
  Filled 2023-05-29: qty 3, 30d supply, fill #2
  Filled 2023-07-14 – 2023-07-31 (×2): qty 3, 30d supply, fill #3

## 2023-01-27 NOTE — Progress Notes (Signed)
Subjective:    Patient ID: Mitchell Castillo, male    DOB: 1971-04-10, 52 y.o.   MRN: [id number]  HPI Here for a well exam. He feels well. His retinopathy is stable.    Review of Systems  Constitutional: Negative.   HENT: Negative.    Eyes: Negative.   Respiratory: Negative.    Cardiovascular: Negative.   Gastrointestinal: Negative.   Genitourinary: Negative.   Musculoskeletal: Negative.   Skin: Negative.   Neurological: Negative.   Psychiatric/Behavioral: Negative.         Objective:   Physical Exam Constitutional:      General: He is not in acute distress.    Appearance: Normal appearance. He is well-developed. He is not diaphoretic.  HENT:     Head: Normocephalic and atraumatic.     Right Ear: External ear normal.     Left Ear: External ear normal.     Nose: Nose normal.     Mouth/Throat:     Pharynx: No oropharyngeal exudate.  Eyes:     General: No scleral icterus.       Right eye: No discharge.        Left eye: No discharge.     Conjunctiva/sclera: Conjunctivae normal.     Pupils: Pupils are equal, round, and reactive to light.  Neck:     Thyroid: No thyromegaly.     Vascular: No JVD.     Trachea: No tracheal deviation.  Cardiovascular:     Rate and Rhythm: Normal rate and regular rhythm.     Pulses: Normal pulses.     Heart sounds: Normal heart sounds. No murmur heard.    No friction rub. No gallop.  Pulmonary:     Effort: Pulmonary effort is normal. No respiratory distress.     Breath sounds: Normal breath sounds. No wheezing or rales.  Chest:     Chest wall: No tenderness.  Abdominal:     General: Bowel sounds are normal. There is no distension.     Palpations: Abdomen is soft. There is no mass.     Tenderness: There is no abdominal tenderness. There is no guarding or rebound.  Genitourinary:    Penis: Normal. No tenderness.      Testes: Normal.     Prostate: Normal.     Rectum: Normal. Guaiac result negative.  Musculoskeletal:        General: No  tenderness. Normal range of motion.     Cervical back: Neck supple.  Lymphadenopathy:     Cervical: No cervical adenopathy.  Skin:    General: Skin is warm and dry.     Coloration: Skin is not pale.     Findings: No erythema or rash.  Neurological:     General: No focal deficit present.     Mental Status: He is alert and oriented to person, place, and time.     Cranial Nerves: No cranial nerve deficit.     Motor: No abnormal muscle tone.     Coordination: Coordination normal.     Deep Tendon Reflexes: Reflexes are normal and symmetric. Reflexes normal.  Psychiatric:        Mood and Affect: Mood normal.        Behavior: Behavior normal.        Thought Content: Thought content normal.        Judgment: Judgment normal.           Assessment & Plan:  Well exam. We discussed diet and exercise. Get  fasting labs. Gershon Crane, MD

## 2023-01-28 ENCOUNTER — Other Ambulatory Visit: Payer: Self-pay

## 2023-03-02 ENCOUNTER — Other Ambulatory Visit: Payer: Self-pay

## 2023-03-02 ENCOUNTER — Other Ambulatory Visit (HOSPITAL_BASED_OUTPATIENT_CLINIC_OR_DEPARTMENT_OTHER): Payer: Self-pay

## 2023-04-20 ENCOUNTER — Encounter: Payer: Self-pay | Admitting: Family Medicine

## 2023-04-23 NOTE — Telephone Encounter (Signed)
Please advise no appointments available

## 2023-04-24 NOTE — Telephone Encounter (Signed)
Call in Valtrex 1000 mg TID as needed for shingles, #30 with 2 rf

## 2023-04-30 ENCOUNTER — Other Ambulatory Visit (HOSPITAL_BASED_OUTPATIENT_CLINIC_OR_DEPARTMENT_OTHER): Payer: Self-pay

## 2023-04-30 MED ORDER — VALACYCLOVIR HCL 1 G PO TABS
1000.0000 mg | ORAL_TABLET | Freq: Three times a day (TID) | ORAL | 2 refills | Status: AC | PRN
  Filled 2023-04-30: qty 30, 10d supply, fill #0

## 2023-04-30 NOTE — Telephone Encounter (Signed)
Pt Rx sent and pt notified

## 2023-05-12 ENCOUNTER — Other Ambulatory Visit (HOSPITAL_BASED_OUTPATIENT_CLINIC_OR_DEPARTMENT_OTHER): Payer: Self-pay

## 2023-06-17 ENCOUNTER — Ambulatory Visit: Payer: Commercial Managed Care - PPO | Admitting: Family Medicine

## 2023-06-18 ENCOUNTER — Other Ambulatory Visit (HOSPITAL_BASED_OUTPATIENT_CLINIC_OR_DEPARTMENT_OTHER): Payer: Self-pay

## 2023-06-18 ENCOUNTER — Ambulatory Visit: Payer: Commercial Managed Care - PPO | Admitting: Family Medicine

## 2023-06-18 ENCOUNTER — Encounter: Payer: Self-pay | Admitting: Family Medicine

## 2023-06-18 VITALS — BP 124/78 | HR 68 | Temp 98.3°F | Wt 250.0 lb

## 2023-06-18 DIAGNOSIS — H8302 Labyrinthitis, left ear: Secondary | ICD-10-CM | POA: Diagnosis not present

## 2023-06-18 MED ORDER — MECLIZINE HCL 25 MG PO TABS
25.0000 mg | ORAL_TABLET | ORAL | 0 refills | Status: AC | PRN
  Filled 2023-06-18: qty 60, 10d supply, fill #0

## 2023-06-18 NOTE — Progress Notes (Signed)
   Subjective:    Patient ID: Mitchell Castillo, male    DOB: 06-Jun-1971, 53 y.o.   MRN: [id number]  HPI Here for 12 days of intermittent dizziness, which he describes a the room spinning around him. This began suddenly in the middle of the night when he woke up very dizzy. Sometimes he gets nauseated, but he has not vomited. No headache. No blurred vision. No other neurologic deficits. He feels better when he is still, and sudden movements make this worse. He has also been experiencing a loud ringing in the left ear that comes and goes. His hearing has not been affected. No  recent head trauma. He has never had this before.    Review of Systems  Constitutional: Negative.   HENT:  Positive for tinnitus. Negative for congestion, ear pain, hearing loss, postnasal drip, sinus pressure and sore throat.   Eyes: Negative.   Respiratory: Negative.    Cardiovascular: Negative.   Neurological:  Positive for dizziness. Negative for tremors, seizures, syncope, facial asymmetry, speech difficulty, weakness, light-headedness, numbness and headaches.       Objective:   Physical Exam Constitutional:      Appearance: Normal appearance.     Comments: He walks normally   HENT:     Head: Normocephalic and atraumatic.     Right Ear: Tympanic membrane, ear canal and external ear normal.     Left Ear: Tympanic membrane, ear canal and external ear normal.     Nose: Nose normal.     Mouth/Throat:     Pharynx: Oropharynx is clear.  Eyes:     Conjunctiva/sclera: Conjunctivae normal.     Pupils: Pupils are equal, round, and reactive to light.  Cardiovascular:     Rate and Rhythm: Normal rate and regular rhythm.     Pulses: Normal pulses.     Heart sounds: Normal heart sounds.  Pulmonary:     Effort: Pulmonary effort is normal.     Breath sounds: Normal breath sounds.  Lymphadenopathy:     Cervical: No cervical adenopathy.  Neurological:     General: No focal deficit present.     Mental Status: He is  alert and oriented to person, place, and time.           Assessment & Plan:  Vestibulitis. He will take Zyrtec D daily for the next 2 weeks. He can add Meclizine  as needed. 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 He will follow up as needed.  Garnette Olmsted, MD

## 2023-06-29 ENCOUNTER — Other Ambulatory Visit (HOSPITAL_BASED_OUTPATIENT_CLINIC_OR_DEPARTMENT_OTHER): Payer: Self-pay

## 2023-07-15 ENCOUNTER — Other Ambulatory Visit (HOSPITAL_BASED_OUTPATIENT_CLINIC_OR_DEPARTMENT_OTHER): Payer: Self-pay

## 2023-07-15 ENCOUNTER — Other Ambulatory Visit: Payer: Self-pay

## 2023-07-16 ENCOUNTER — Other Ambulatory Visit (HOSPITAL_BASED_OUTPATIENT_CLINIC_OR_DEPARTMENT_OTHER): Payer: Self-pay

## 2023-07-24 ENCOUNTER — Other Ambulatory Visit (HOSPITAL_BASED_OUTPATIENT_CLINIC_OR_DEPARTMENT_OTHER): Payer: Self-pay

## 2023-07-31 ENCOUNTER — Other Ambulatory Visit (HOSPITAL_BASED_OUTPATIENT_CLINIC_OR_DEPARTMENT_OTHER): Payer: Self-pay

## 2023-08-12 ENCOUNTER — Other Ambulatory Visit (HOSPITAL_COMMUNITY): Payer: Self-pay

## 2023-08-12 DIAGNOSIS — L814 Other melanin hyperpigmentation: Secondary | ICD-10-CM | POA: Diagnosis not present

## 2023-08-12 DIAGNOSIS — L718 Other rosacea: Secondary | ICD-10-CM | POA: Diagnosis not present

## 2023-08-12 DIAGNOSIS — D225 Melanocytic nevi of trunk: Secondary | ICD-10-CM | POA: Diagnosis not present

## 2023-08-12 DIAGNOSIS — L309 Dermatitis, unspecified: Secondary | ICD-10-CM | POA: Diagnosis not present

## 2023-08-12 DIAGNOSIS — L858 Other specified epidermal thickening: Secondary | ICD-10-CM | POA: Diagnosis not present

## 2023-08-12 DIAGNOSIS — D2361 Other benign neoplasm of skin of right upper limb, including shoulder: Secondary | ICD-10-CM | POA: Diagnosis not present

## 2023-08-12 MED ORDER — BETAMETHASONE DIPROPIONATE AUG 0.05 % EX CREA
1.0000 | TOPICAL_CREAM | Freq: Two times a day (BID) | CUTANEOUS | 0 refills | Status: AC
  Filled 2023-08-12: qty 50, 15d supply, fill #0
  Filled 2024-02-14: qty 50, 14d supply, fill #0

## 2023-08-24 ENCOUNTER — Other Ambulatory Visit (HOSPITAL_COMMUNITY): Payer: Self-pay

## 2023-08-24 ENCOUNTER — Other Ambulatory Visit (HOSPITAL_BASED_OUTPATIENT_CLINIC_OR_DEPARTMENT_OTHER): Payer: Self-pay

## 2023-08-24 MED ORDER — BROMFENAC SODIUM 0.07 % OP SOLN
1.0000 [drp] | Freq: Every day | OPHTHALMIC | 2 refills | Status: AC
  Filled 2023-08-24 – 2024-02-14 (×2): qty 3, 30d supply, fill #0

## 2023-09-03 ENCOUNTER — Other Ambulatory Visit (HOSPITAL_BASED_OUTPATIENT_CLINIC_OR_DEPARTMENT_OTHER): Payer: Self-pay

## 2023-10-09 ENCOUNTER — Telehealth: Admitting: Physician Assistant

## 2023-10-09 DIAGNOSIS — B9689 Other specified bacterial agents as the cause of diseases classified elsewhere: Secondary | ICD-10-CM

## 2023-10-09 DIAGNOSIS — J019 Acute sinusitis, unspecified: Secondary | ICD-10-CM

## 2023-10-09 MED ORDER — AMOXICILLIN-POT CLAVULANATE 875-125 MG PO TABS
1.0000 | ORAL_TABLET | Freq: Two times a day (BID) | ORAL | 0 refills | Status: DC
Start: 2023-10-09 — End: 2024-03-07

## 2023-10-09 NOTE — Progress Notes (Signed)

## 2024-02-15 ENCOUNTER — Other Ambulatory Visit (HOSPITAL_COMMUNITY): Payer: Self-pay

## 2024-02-15 ENCOUNTER — Other Ambulatory Visit: Payer: Self-pay

## 2024-02-15 ENCOUNTER — Encounter: Payer: Self-pay | Admitting: Pharmacist

## 2024-02-16 ENCOUNTER — Encounter: Payer: Self-pay | Admitting: Pharmacist

## 2024-02-16 ENCOUNTER — Other Ambulatory Visit: Payer: Self-pay

## 2024-02-18 ENCOUNTER — Other Ambulatory Visit (HOSPITAL_COMMUNITY): Payer: Self-pay

## 2024-03-07 ENCOUNTER — Encounter: Payer: Self-pay | Admitting: Family Medicine

## 2024-03-07 ENCOUNTER — Ambulatory Visit (INDEPENDENT_AMBULATORY_CARE_PROVIDER_SITE_OTHER): Admitting: Family Medicine

## 2024-03-07 VITALS — BP 110/78 | HR 77 | Temp 98.9°F | Ht 68.0 in | Wt 245.0 lb

## 2024-03-07 DIAGNOSIS — H9313 Tinnitus, bilateral: Secondary | ICD-10-CM | POA: Insufficient documentation

## 2024-03-07 DIAGNOSIS — Z0000 Encounter for general adult medical examination without abnormal findings: Secondary | ICD-10-CM | POA: Diagnosis not present

## 2024-03-07 MED ORDER — OZEMPIC (0.25 OR 0.5 MG/DOSE) 2 MG/3ML ~~LOC~~ SOPN: 0.2500 mg | PEN_INJECTOR | SUBCUTANEOUS | Status: AC

## 2024-03-07 NOTE — Progress Notes (Signed)
 Subjective:    Patient ID: Mitchell Castillo, male    DOB: 1971/03/04, 53 y.o.   MRN: [id number]  HPI Here for a well exam. He still complains of tinnitus in both ears off and on. He has some mild vertigo at times, but he tries to move slowly to avoid this.    Review of Systems  Constitutional: Negative.   HENT:  Positive for tinnitus.   Eyes: Negative.   Respiratory: Negative.    Cardiovascular: Negative.   Gastrointestinal: Negative.   Genitourinary: Negative.   Musculoskeletal: Negative.   Skin: Negative.   Neurological: Negative.   Psychiatric/Behavioral: Negative.         Objective:   Physical Exam Constitutional:      General: He is not in acute distress.    Appearance: He is well-developed. He is obese. He is not diaphoretic.  HENT:     Head: Normocephalic and atraumatic.     Right Ear: External ear normal.     Left Ear: External ear normal.     Nose: Nose normal.     Mouth/Throat:     Pharynx: No oropharyngeal exudate.  Eyes:     General: No scleral icterus.       Right eye: No discharge.        Left eye: No discharge.     Conjunctiva/sclera: Conjunctivae normal.     Pupils: Pupils are equal, round, and reactive to light.  Neck:     Thyroid : No thyromegaly.     Vascular: No JVD.     Trachea: No tracheal deviation.  Cardiovascular:     Rate and Rhythm: Normal rate and regular rhythm.     Pulses: Normal pulses.     Heart sounds: Normal heart sounds. No murmur heard.    No friction rub. No gallop.  Pulmonary:     Effort: Pulmonary effort is normal. No respiratory distress.     Breath sounds: Normal breath sounds. No wheezing or rales.  Chest:     Chest wall: No tenderness.  Abdominal:     General: Bowel sounds are normal. There is no distension.     Palpations: Abdomen is soft. There is no mass.     Tenderness: There is no abdominal tenderness. There is no guarding or rebound.  Genitourinary:    Penis: Normal. No tenderness.      Testes: Normal.      Prostate: Normal.     Rectum: Normal. Guaiac result negative.  Musculoskeletal:        General: No tenderness. Normal range of motion.     Cervical back: Neck supple.  Lymphadenopathy:     Cervical: No cervical adenopathy.  Skin:    General: Skin is warm and dry.     Coloration: Skin is not pale.     Findings: No erythema or rash.  Neurological:     General: No focal deficit present.     Mental Status: He is alert and oriented to person, place, and time.     Cranial Nerves: No cranial nerve deficit.     Motor: No abnormal muscle tone.     Coordination: Coordination normal.     Deep Tendon Reflexes: Reflexes are normal and symmetric. Reflexes normal.  Psychiatric:        Mood and Affect: Mood normal.        Behavior: Behavior normal.        Thought Content: Thought content normal.        Judgment: Judgment  normal.           Assessment & Plan:  Well exam. We discussed diet and exercise. Get fasting labs. Refer to ENT to address the tinnitus.  Garnette Olmsted, MD

## 2024-03-11 ENCOUNTER — Other Ambulatory Visit (INDEPENDENT_AMBULATORY_CARE_PROVIDER_SITE_OTHER)

## 2024-03-11 ENCOUNTER — Ambulatory Visit: Payer: Self-pay | Admitting: Family Medicine

## 2024-03-11 DIAGNOSIS — Z0000 Encounter for general adult medical examination without abnormal findings: Secondary | ICD-10-CM

## 2024-03-11 LAB — CBC WITH DIFFERENTIAL/PLATELET
Basophils Absolute: 0.1 K/uL (ref 0.0–0.1)
Basophils Relative: 1.3 % (ref 0.0–3.0)
Eosinophils Absolute: 0.1 K/uL (ref 0.0–0.7)
Eosinophils Relative: 1.9 % (ref 0.0–5.0)
HCT: 48.9 % (ref 39.0–52.0)
Hemoglobin: 16.5 g/dL (ref 13.0–17.0)
Lymphocytes Relative: 24.2 % (ref 12.0–46.0)
Lymphs Abs: 1.8 K/uL (ref 0.7–4.0)
MCHC: 33.7 g/dL (ref 30.0–36.0)
MCV: 91 fl (ref 78.0–100.0)
Monocytes Absolute: 0.8 K/uL (ref 0.1–1.0)
Monocytes Relative: 11.4 % (ref 3.0–12.0)
Neutro Abs: 4.4 K/uL (ref 1.4–7.7)
Neutrophils Relative %: 61.2 % (ref 43.0–77.0)
Platelets: 246 K/uL (ref 150.0–400.0)
RBC: 5.37 Mil/uL (ref 4.22–5.81)
RDW: 13.6 % (ref 11.5–15.5)
WBC: 7.3 K/uL (ref 4.0–10.5)

## 2024-03-11 LAB — LIPID PANEL
Cholesterol: 150 mg/dL (ref 0–200)
HDL: 40.4 mg/dL (ref >39.00)
LDL Cholesterol: 84 mg/dL (ref 0–99)
NonHDL: 109.23
Total CHOL/HDL Ratio: 4
Triglycerides: 127 mg/dL (ref 0.0–149.0)
VLDL: 25.4 mg/dL (ref 0.0–40.0)

## 2024-03-11 LAB — PSA: PSA: 1.69 ng/mL (ref 0.10–4.00)

## 2024-03-11 LAB — TSH: TSH: 1.35 u[IU]/mL (ref 0.35–5.50)

## 2024-03-11 LAB — BASIC METABOLIC PANEL WITH GFR
BUN: 17 mg/dL (ref 6–23)
CO2: 26 meq/L (ref 19–32)
Calcium: 9.7 mg/dL (ref 8.4–10.5)
Chloride: 102 meq/L (ref 96–112)
Creatinine, Ser: 1.14 mg/dL (ref 0.40–1.50)
GFR: 72.86 mL/min (ref >60.00)
Glucose, Bld: 97 mg/dL (ref 70–99)
Potassium: 4.5 meq/L (ref 3.5–5.1)
Sodium: 137 meq/L (ref 135–145)

## 2024-03-11 LAB — HEMOGLOBIN A1C: Hgb A1c MFr Bld: 5.3 % (ref 4.6–6.5)

## 2024-03-11 LAB — HEPATIC FUNCTION PANEL
ALT: 20 U/L (ref 0–53)
AST: 17 U/L (ref 0–37)
Albumin: 4.5 g/dL (ref 3.5–5.2)
Alkaline Phosphatase: 51 U/L (ref 39–117)
Bilirubin, Direct: 0.1 mg/dL (ref 0.0–0.3)
Total Bilirubin: 0.8 mg/dL (ref 0.2–1.2)
Total Protein: 7.2 g/dL (ref 6.0–8.3)

## 2024-03-27 ENCOUNTER — Encounter: Payer: Self-pay | Admitting: Family Medicine

## 2024-03-29 ENCOUNTER — Other Ambulatory Visit (HOSPITAL_BASED_OUTPATIENT_CLINIC_OR_DEPARTMENT_OTHER): Payer: Self-pay

## 2024-03-29 MED ORDER — BROMFENAC SODIUM 0.07 % OP SOLN
1.0000 [drp] | Freq: Every day | OPHTHALMIC | 2 refills | Status: AC
  Filled 2024-03-29: qty 3, 30d supply, fill #0

## 2024-04-16 IMAGING — MR MR THORACIC SPINE W/O CM
6 series · 43 of 48 positions shown · non-contrast
Comparison: Thoracic spine radiographs 09/03/2021

CLINICAL DATA: Mid back pain. Chronic right-sided midthoracic pain.

EXAM:
MRI THORACIC SPINE WITHOUT CONTRAST
TECHNIQUE: Multiplanar, multisequence MR imaging of the thoracic spine was
performed. No intravenous contrast was administered.

[Series 16: T1 · sagittal · 4.0mm · 1.72mm/px · 7 of 15 slices shown (1 of 2)]
[im 1/15]
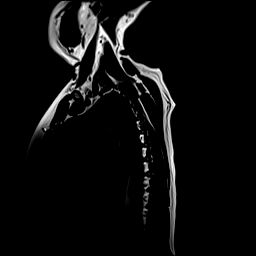
[im 3/15]
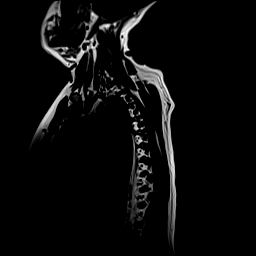
[im 5/15]
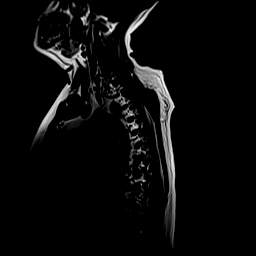
[im 8/15]
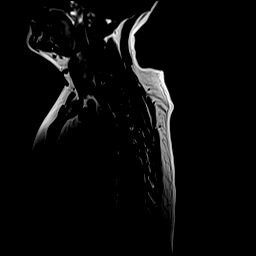
[im 10/15]
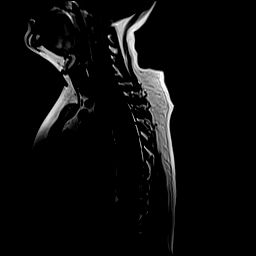
[im 12/15]
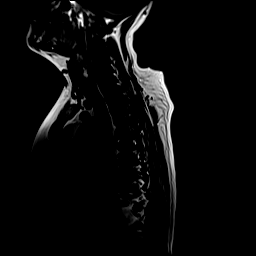
[im 15/15]
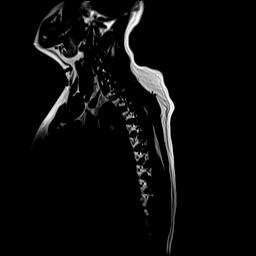

[Series 17: STIR · sagittal · 3.0mm · 1.00mm/px · 8 of 18 slices shown]
[im 1/18]
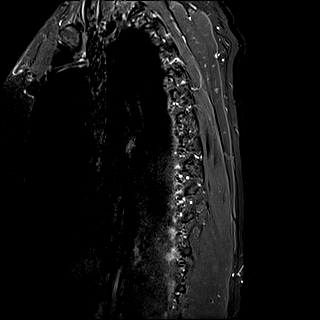
[im 3/18]
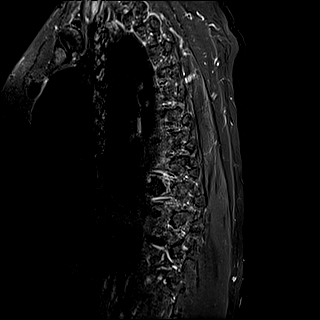
[im 5/18]
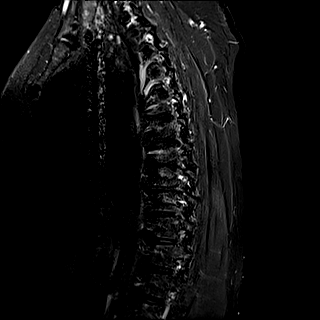
[im 8/18]
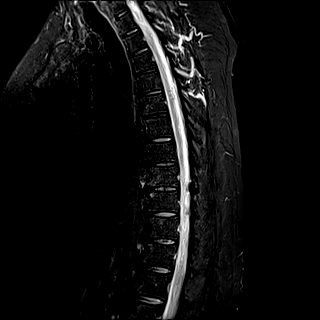
[im 10/18]
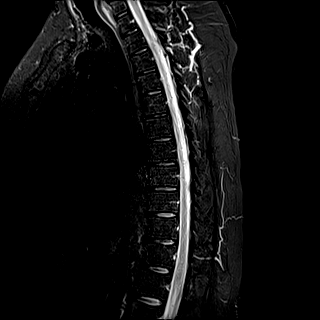
[im 13/18]
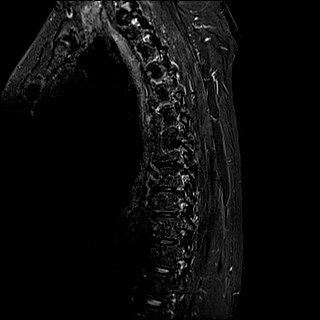
[im 15/18]
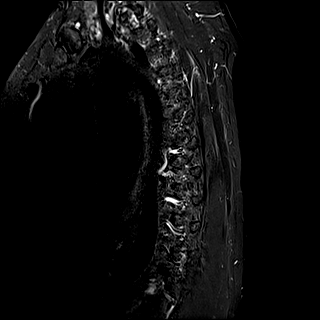
[im 18/18]
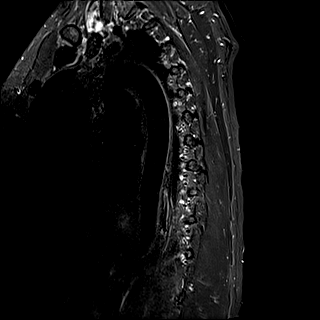

[Series 18: T1 · sagittal · 3.0mm · 1.00mm/px · 8 of 15 slices shown (2 of 2)]
[im 1/15]
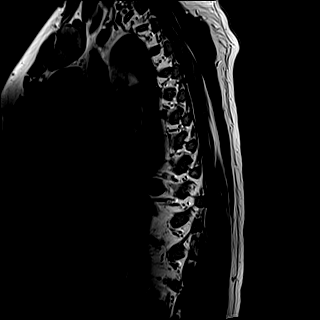
[im 3/15]
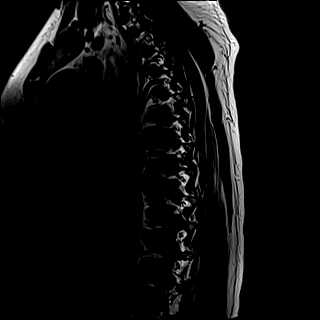
[im 5/15]
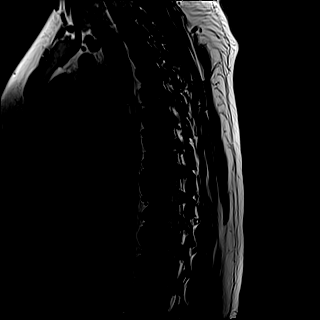
[im 7/15]
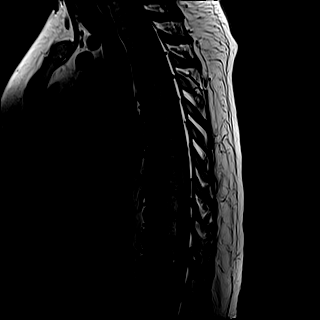
[im 9/15]
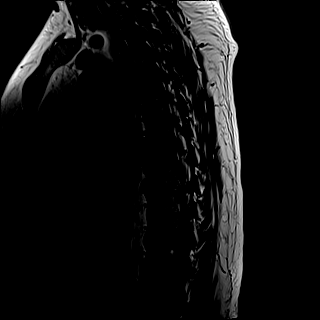
[im 11/15]
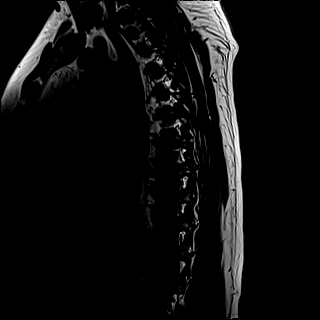
[im 13/15]
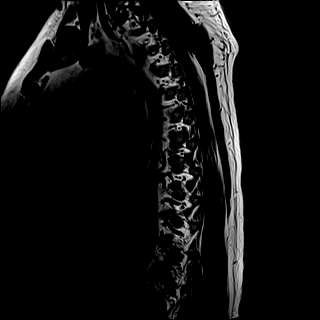
[im 15/15]
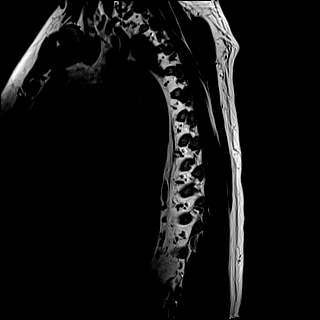

[Series 19: T2 · sagittal · 3.0mm · 0.83mm/px · 9 of 18 slices shown (1 of 2)]
[im 1/18]
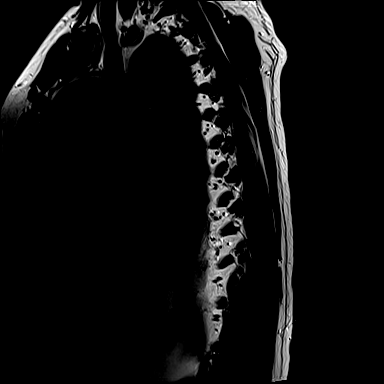
[im 3/18]
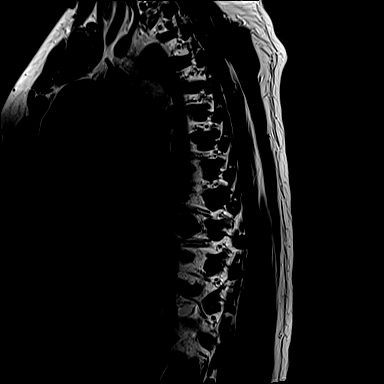
[im 5/18]
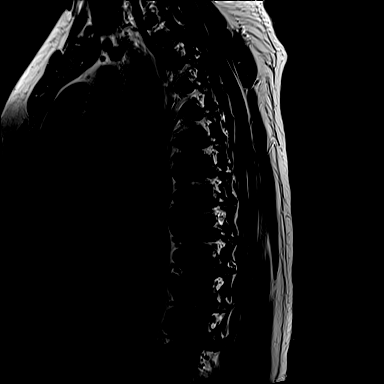
[im 7/18]
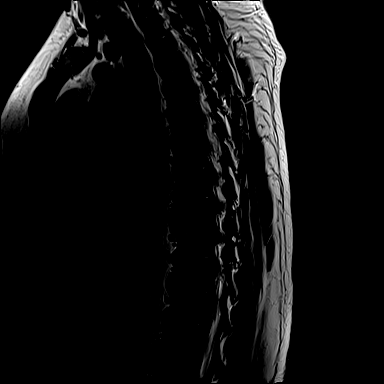
[im 9/18]
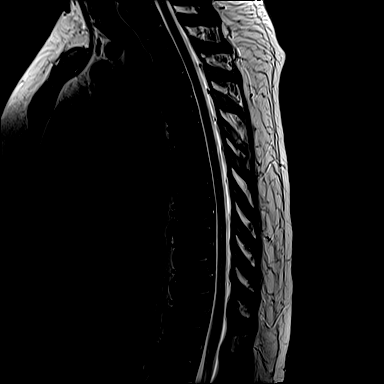
[im 11/18]
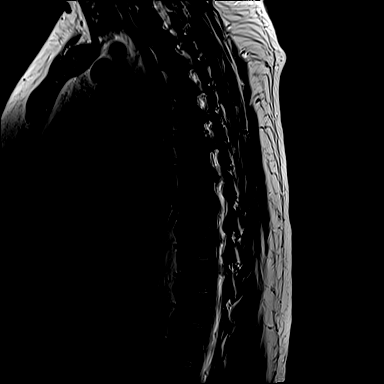
[im 13/18]
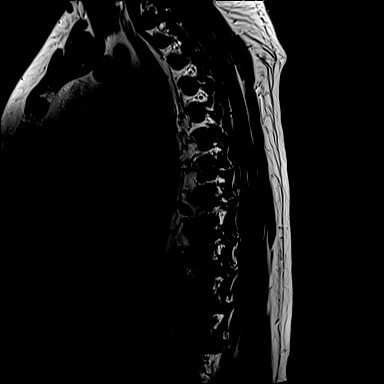
[im 15/18]
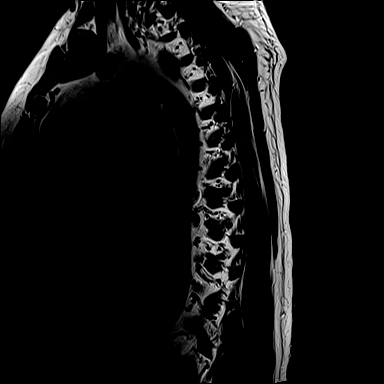
[im 18/18]
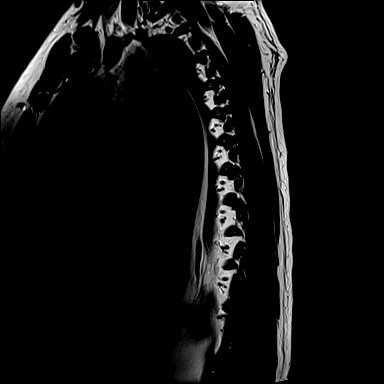

[Series 20: T2 · axial · 4.0mm · 0.78mm/px · z∈[-317,-86]mm · 8 of 15 slices shown (2 of 2)]
[im 1/15]
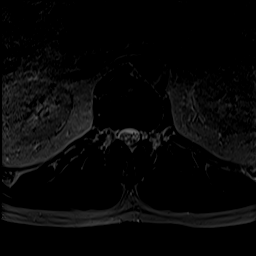
[im 3/15]
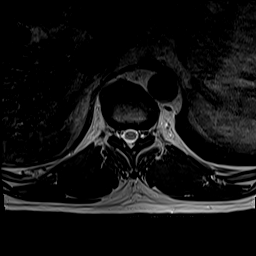
[im 5/15]
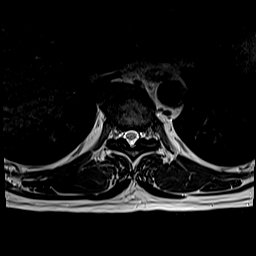
[im 7/15]
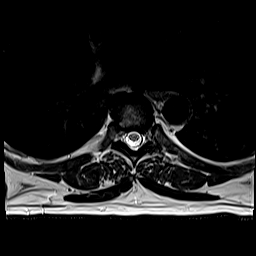
[im 9/15]
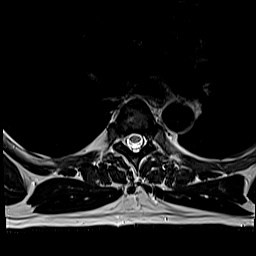
[im 11/15]
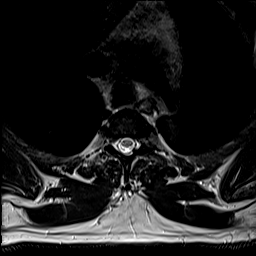
[im 13/15]
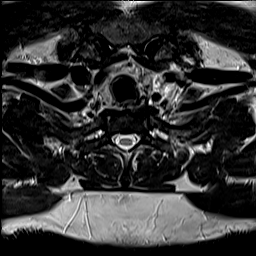
[im 15/15]
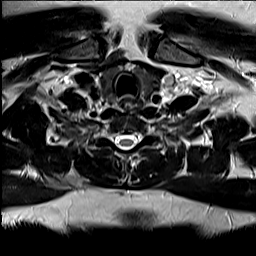

[Series 21: t2_me2d_tra · axial · 4.0mm · 0.39mm/px · z∈[-317,-229]mm · 3 of 15 slices shown]
[im 1/15]
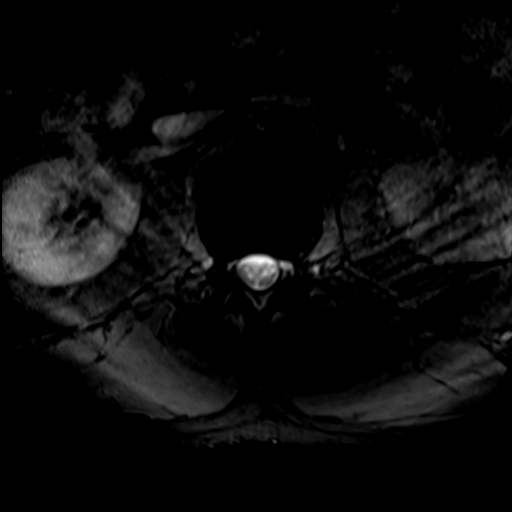
[im 3/15]
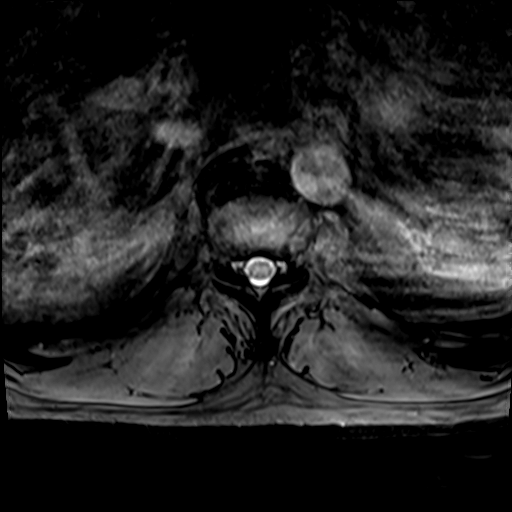
[im 5/15]
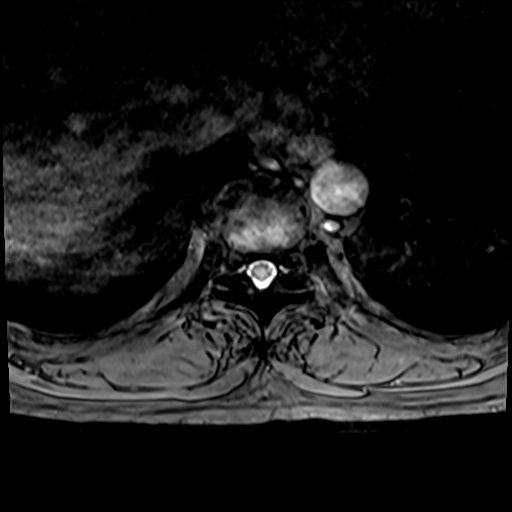

[43 of 48 positions shown; findings below may reference images not displayed]

FINDINGS: Alignment: No significant listhesis is present. Thoracic kyphosis
preserved.

Vertebrae: Marrow signal and vertebral body heights are normal.

Cord:  Normal signal and morphology.

Paraspinal and other soft tissues: Paraspinous soft tissues are
within normal limits. Lungs are clear. Visualized mediastinum is
unremarkable. Visualized upper abdomen is within normal limits.
Paraspinous musculature is normal.

Disc levels:

No significant focal disc protrusion or stenosis is present. Mild
facet degenerative changes are noted at T7-8, T8-9 and T9-10, right
greater than left. No associated stenosis is present. The foramina
are patent bilaterally.
IMPRESSION: 1. Mild facet degenerative changes at T7-8, T8-9 and T9-10, right
greater than left.
2. Otherwise negative MRI of the thoracic spine.

## 2024-04-25 ENCOUNTER — Ambulatory Visit (INDEPENDENT_AMBULATORY_CARE_PROVIDER_SITE_OTHER)

## 2024-04-25 ENCOUNTER — Encounter (INDEPENDENT_AMBULATORY_CARE_PROVIDER_SITE_OTHER): Payer: Self-pay

## 2024-04-25 VITALS — BP 137/84 | HR 69 | Temp 98.0°F | Ht 69.0 in | Wt 235.0 lb

## 2024-04-25 DIAGNOSIS — H9312 Tinnitus, left ear: Secondary | ICD-10-CM

## 2024-04-25 DIAGNOSIS — H8110 Benign paroxysmal vertigo, unspecified ear: Secondary | ICD-10-CM

## 2024-04-25 DIAGNOSIS — H9313 Tinnitus, bilateral: Secondary | ICD-10-CM

## 2024-04-25 DIAGNOSIS — H9192 Unspecified hearing loss, left ear: Secondary | ICD-10-CM

## 2024-04-25 NOTE — Progress Notes (Signed)
 HPI:   Discussed the use of AI scribe software for clinical note transcription with the patient, who gave verbal consent to proceed.  History of Present Illness Mitchell Castillo is a 53 year old male who presents with persistent ear symptoms. He was referred by his primary care physician for evaluation of his ear symptoms.  He has a history of hearing loss and has worked as a engineer, civil (consulting) on an ambulance for 27 years, which exposed him to significant noise levels. He experiences difficulty hearing in environments with loud ambient noise, such as restaurants or social functions, and has trouble with higher-pitched sounds.  His symptoms began in December of the previous year with a sudden 'pop' in his ear while asleep, followed by intense vertigo, nausea, and sweating. This episode was severe enough to cause him to sleep sitting up in a chair. A similar, less severe episode occurred a few weeks later, requiring him to avoid sleeping on his left side for a period of time.  Since the initial episode, he has experienced persistent tinnitus, primarily in the left ear, which is present continuously. The tinnitus can be loud enough at night to disrupt his sleep. He also reports occasional dizziness, particularly when moving quickly or getting into certain positions, such as when working under a car.  He has not missed work due to these symptoms but has had to adjust his movements to avoid triggering dizziness. He has tried exercises to alleviate the dizziness. He has not needed to perform these exercises in recent months.  He has a history of sleep apnea, managed with CPAP nasal pillows, which has significantly improved his symptoms of daytime sleepiness.    PMH/Meds/All/SocHx/FamHx/ROS: Past Medical History:  Diagnosis Date   Allergy    Central serous chorioretinopathy of eye, left    sees Dr. Josh   OSA (obstructive sleep apnea) 05/29/2016   uses CPAP    Past Surgical History:  Procedure Laterality  Date   CHOLECYSTECTOMY     COLONOSCOPY WITH PROPOFOL  N/A 10/26/2020   per Dr. Rollin, hyperplastic polyp, repeat in 3yrs   POLYPECTOMY  10/26/2020   Procedure: POLYPECTOMY;  Surgeon: Rollin Dover, MD;  Location: [facility];  Service: Endoscopy;;   VASECTOMY     No family history of bleeding disorders, wound healing problems or difficulty with anesthesia.  Social Connections: Moderately Isolated (03/06/2024)   Social Connection and Isolation Panel    Frequency of Communication with Friends and Family: More than three times a week    Frequency of Social Gatherings with Friends and Family: Twice a week    Attends Religious Services: Never    Database Administrator or Organizations: No    Attends Engineer, Structural: Not on file    Marital Status: Married    Current Outpatient Medications:    acetaminophen (TYLENOL) 500 MG tablet, Take 500-1,000 mg by mouth every 6 (six) hours as needed for moderate pain or mild pain., Disp: , Rfl:    augmented betamethasone  dipropionate (DIPROLENE -AF) 0.05 % cream, Apply 1 Application topically to affected area 2 (two) times daily for a week., Disp: 50 g, Rfl: 0   Bromfenac  Sodium (PROLENSA ) 0.07 % SOLN, Place 1 drop into the left eye daily., Disp: 3 mL, Rfl: 2   Cholecalciferol (VITAMIN D3) 125 MCG (5000 UT) TABS, Take 5,000 Units by mouth daily., Disp: , Rfl:    cyclobenzaprine  (FLEXERIL ) 10 MG tablet, Take 1 tablet (10 mg total) by mouth 3 (three) times daily as needed  for muscle spasms. (Patient taking differently: Take 10 mg by mouth as needed for muscle spasms.), Disp: 60 tablet, Rfl: 2   ibuprofen (ADVIL) 200 MG tablet, Take 200-800 mg by mouth every 6 (six) hours as needed (Back pain)., Disp: , Rfl:    meclizine  (ANTIVERT ) 25 MG tablet, Take 1 tablet (25 mg total) by mouth every 4 (four) hours as needed for dizziness., Disp: 60 tablet, Rfl: 0   Multiple Vitamin (MULTIVITAMIN) tablet, Take 1 tablet by mouth daily., Disp: , Rfl:     scopolamine  (TRANSDERM-SCOP) 1 MG/3DAYS, Place 1 patch onto the skin every 3 days., Disp: 10 patch, Rfl: 5   Semaglutide,0.25 or 0.5MG /DOS, (OZEMPIC, 0.25 OR 0.5 MG/DOSE,) 2 MG/3ML SOPN, Inject 0.25 mg into the skin once a week., Disp: , Rfl:    Bromfenac  Sodium (PROLENSA ) 0.07 % SOLN, Place 1 drop into the left eye daily as required. (Patient not taking: Reported on 04/25/2024), Disp: 3 mL, Rfl: 10   Bromfenac  Sodium (PROLENSA ) 0.07 % SOLN, Place 1 drop into the left eye daily. (Patient not taking: Reported on 04/25/2024), Disp: 3 mL, Rfl: 2 A complete ROS was performed with pertinent positives/negatives noted in the HPI. The remainder of the ROS are negative.   Physical Exam:  BP 137/84 (BP Location: Left Arm, Patient Position: Sitting, Cuff Size: Normal)   Pulse 69   Temp 98 F (36.7 C)   Ht 5' 9 (1.753 m)   Wt 235 lb (106.6 kg)   SpO2 97%   BMI 34.70 kg/m  General: Well developed, well nourished. No acute distress.  Head/Face: Normocephalic. No sinus tenderness. Facial nerve intact and equal bilaterally. No facial lacerations. Eyes: PERRL, no scleral icterus or conjunctival hemorrhage. EOMI. Ears: No gross deformity. Normal external canal. Tympanic membrane in tact bilaterally Hearing: Normal speech reception. Nose: No gross deformity or lesions. No purulent discharge. Bilateral mild inferior turbinate hypertrophy. Left septal deviation Mouth/Oropharynx: Lips without any lesions. No mucosal lesions within the oropharynx. No tonsillar enlargement, exudate, or lesions. Pharyngeal walls symmetrical. Uvula midline. Tongue midline without lesions. Larynx: See TFL if applicable Nasopharynx: See TFL if applicable Neck: Trachea midline. No masses. No thyromegaly or nodules palpated. No crepitus. Lymphatic: No lymphadenopathy in the neck. Respiratory: No stridor or distress. Room air. Cardiovascular: Regular rate and rhythm. Extremities: No edema or cyanosis. Warm and well-perfused. Skin:  No scars or lesions on face or neck. Neurologic: CN II-XII grossly intact. Moving all extremities without gross abnormality. Other:  Independent Review of Additional Tests or Records: None Procedures: None Impression & Plans: Assessment & Plan Benign paroxysmal positional vertigo (BPPV) - Perform Brandt-Daroff exercises to reduce recurrence. - Use meclizine  for acute episodes, limited to 2-3 days as needed.  Left ear tinnitus and hearing loss Chronic tinnitus and hearing loss, more pronounced in the left ear, possibly noise-induced. Tinnitus disrupts sleep. - Ordered hearing test - Discussed environmental changes such as noise machine or fan at night to help with tinnitus at night  Follow-up after audiogram.  Adah Malkin, DO [facility]

## 2024-05-30 ENCOUNTER — Ambulatory Visit (INDEPENDENT_AMBULATORY_CARE_PROVIDER_SITE_OTHER): Admitting: Audiology

## 2024-05-30 DIAGNOSIS — H9042 Sensorineural hearing loss, unilateral, left ear, with unrestricted hearing on the contralateral side: Secondary | ICD-10-CM | POA: Diagnosis not present

## 2024-05-30 NOTE — Progress Notes (Addendum)
 " [address] [place], [place] [address] [phone]  Audiological Evaluation    Name: Mitchell Castillo     DOB:   [birth year]      MRN:   [id number]                                                                                     Service Date: 05/30/2024     Accompanied by: self     Patient comes today after Dr. Mila, ENT sent a referral for a hearing evaluation due to concerns with tinnitus.   Symptoms Yes Details  Hearing loss  [x]  Has difficulty hearing in restaurants. May hear better out of one ear, unsure which one  Tinnitus  [x]  Started about 1 year ago, affected his sleep, now seems to be able to ignore most of the time.   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  Previous ear surgeries  []    Family history of hearing loss  [x]  Both sides of the family, with age  Amplification  []    Other  []      Otoscopy: Right ear: Clear external ear canal and notable landmarks visualized on the tympanic membrane. Left ear:  Clear external ear canal and notable landmarks visualized on the tympanic membrane.  Tympanometry: Right ear: Type A - Normal external ear canal volume with normal middle ear pressure and normal tympanic membrane compliance. Findings are consistent with normal middle ear function. Left ear: Type A - Normal external ear canal volume with normal middle ear pressure and normal tympanic membrane compliance. Findings are consistent with normal middle ear function.   Hearing Evaluation The hearing test results were completed under headphones and re-checked with inserts and results are deemed to be of good reliability. Test technique:  conventional    Pure tone Audiometry: Right ear- Normal  hearing from [phone]. Left ear-  Normal sloping to mild sensorineural hearing loss from 250 Hz - 8000 Hz.  Speech Audiometry: Right ear- Speech Reception Threshold (SRT) was obtained at 0 dBHL. Left ear-Speech Reception Threshold (SRT) was obtained at 5 dBHL.   Word Recognition Score Tested using NU-6 (recorded) Right ear: 100% was obtained at a presentation level of 50 dBHL with contralateral masking which is deemed as  excellent. Left ear: 100% was obtained at a presentation level of 50 dBHL with contralateral masking which is deemed as  excellent.   Impression: There is not a significant difference in pure-tone thresholds between ears., There is not a significant difference in the word recognition score in between ears.    Recommendations: Follow up with ENT as scheduled. Return for a hearing evaluation if concerns with hearing changes arise or per MD recommendation. Hearing protection use when exposed to loud/damaging sounds.  Consider various tinnitus strategies, including the use of a sound generator, hearing aids, and/or tinnitus retraining therapy.  Mitchell Castillo, AUD  "
